# Patient Record
Sex: Female | Born: 1935
Health system: Southern US, Community
[De-identification: ages and names within clinical notes are randomized; demographics above are authoritative.]

## PROBLEM LIST (undated history)

## (undated) DIAGNOSIS — I340 Nonrheumatic mitral (valve) insufficiency: Secondary | ICD-10-CM

## (undated) DIAGNOSIS — D649 Anemia, unspecified: Secondary | ICD-10-CM

## (undated) DIAGNOSIS — I714 Abdominal aortic aneurysm, without rupture, unspecified: Secondary | ICD-10-CM

## (undated) DIAGNOSIS — I509 Heart failure, unspecified: Secondary | ICD-10-CM

## (undated) DIAGNOSIS — R569 Unspecified convulsions: Secondary | ICD-10-CM

## (undated) DIAGNOSIS — I4891 Unspecified atrial fibrillation: Secondary | ICD-10-CM

## (undated) DIAGNOSIS — D72819 Decreased white blood cell count, unspecified: Secondary | ICD-10-CM

## (undated) DIAGNOSIS — E785 Hyperlipidemia, unspecified: Secondary | ICD-10-CM

## (undated) DIAGNOSIS — I4892 Unspecified atrial flutter: Secondary | ICD-10-CM

## (undated) DIAGNOSIS — K635 Polyp of colon: Secondary | ICD-10-CM

## (undated) DIAGNOSIS — S0591XA Unspecified injury of right eye and orbit, initial encounter: Secondary | ICD-10-CM

## (undated) DIAGNOSIS — S82899A Other fracture of unspecified lower leg, initial encounter for closed fracture: Secondary | ICD-10-CM

## (undated) DIAGNOSIS — N390 Urinary tract infection, site not specified: Secondary | ICD-10-CM

## (undated) DIAGNOSIS — M1990 Unspecified osteoarthritis, unspecified site: Secondary | ICD-10-CM

## (undated) DIAGNOSIS — M810 Age-related osteoporosis without current pathological fracture: Secondary | ICD-10-CM

## (undated) HISTORY — PX: MITRAL VALVULOPLASTY: SHX143

## (undated) HISTORY — PX: OTHER SURGICAL HISTORY: SHX169

## (undated) HISTORY — PX: CLOSED REDUCTION HUMERAL SHAFT FRACTURE: SUR223

## (undated) HISTORY — PX: ABDOMINAL HYSTERECTOMY: SHX81

## (undated) HISTORY — PX: ORIF WRIST FRACTURE: SHX2133

## (undated) HISTORY — PX: FOOT SURGERY: SHX648

## (undated) HISTORY — PX: EYE SURGERY: SHX253

## (undated) HISTORY — PX: CLOSED REDUCTION RADIAL SHAFT FRACTURE: SUR239

---

## 2004-07-21 ENCOUNTER — Ambulatory Visit: Payer: Self-pay | Admitting: Internal Medicine

## 2005-04-02 ENCOUNTER — Ambulatory Visit: Payer: Self-pay | Admitting: Unknown Physician Specialty

## 2005-09-10 ENCOUNTER — Ambulatory Visit: Payer: Self-pay | Admitting: Internal Medicine

## 2006-06-17 ENCOUNTER — Ambulatory Visit: Payer: Self-pay | Admitting: Internal Medicine

## 2006-06-21 ENCOUNTER — Ambulatory Visit: Payer: Self-pay | Admitting: Internal Medicine

## 2006-09-15 ENCOUNTER — Ambulatory Visit: Payer: Self-pay | Admitting: Internal Medicine

## 2007-07-05 ENCOUNTER — Ambulatory Visit: Payer: Self-pay | Admitting: Urology

## 2007-09-19 ENCOUNTER — Ambulatory Visit: Payer: Self-pay | Admitting: Internal Medicine

## 2008-06-26 ENCOUNTER — Ambulatory Visit: Payer: Self-pay | Admitting: Unknown Physician Specialty

## 2008-09-20 ENCOUNTER — Ambulatory Visit: Payer: Self-pay | Admitting: Internal Medicine

## 2009-09-23 ENCOUNTER — Ambulatory Visit: Payer: Self-pay | Admitting: Internal Medicine

## 2010-11-04 ENCOUNTER — Ambulatory Visit: Payer: Self-pay | Admitting: Internal Medicine

## 2011-11-18 ENCOUNTER — Ambulatory Visit: Payer: Self-pay | Admitting: Internal Medicine

## 2012-02-06 ENCOUNTER — Inpatient Hospital Stay: Payer: Self-pay | Admitting: Specialist

## 2012-02-06 LAB — URINALYSIS, COMPLETE
Bilirubin,UR: NEGATIVE
Blood: NEGATIVE
Glucose,UR: NEGATIVE mg/dL (ref 0–75)
Ph: 8 (ref 4.5–8.0)
Protein: NEGATIVE
Squamous Epithelial: NONE SEEN
WBC UR: 8 /HPF (ref 0–5)

## 2012-02-06 LAB — CBC
Platelet: 225 10*3/uL (ref 150–440)
RBC: 4.06 10*6/uL (ref 3.80–5.20)
RDW: 13.2 % (ref 11.5–14.5)
WBC: 5.7 10*3/uL (ref 3.6–11.0)

## 2012-02-06 LAB — BASIC METABOLIC PANEL
Anion Gap: 6 — ABNORMAL LOW (ref 7–16)
BUN: 13 mg/dL (ref 7–18)
Chloride: 106 mmol/L (ref 98–107)
Co2: 30 mmol/L (ref 21–32)
Creatinine: 0.75 mg/dL (ref 0.60–1.30)
EGFR (Non-African Amer.): >60
Potassium: 3.9 mmol/L (ref 3.5–5.1)

## 2012-02-06 LAB — PROTIME-INR
INR: 0.9
Prothrombin Time: 12.2 secs (ref 11.5–14.7)

## 2012-02-06 LAB — PHENYTOIN LEVEL, TOTAL: Dilantin: 10.9 ug/mL (ref 10.0–20.0)

## 2012-02-06 LAB — PHENOBARBITAL LEVEL: Phenobarbital: 15.6 ug/mL (ref 15.0–40.0)

## 2012-02-07 LAB — BASIC METABOLIC PANEL
Anion Gap: 5 — ABNORMAL LOW (ref 7–16)
BUN: 11 mg/dL (ref 7–18)
Chloride: 106 mmol/L (ref 98–107)
Co2: 28 mmol/L (ref 21–32)
Creatinine: 0.78 mg/dL (ref 0.60–1.30)
Glucose: 147 mg/dL — ABNORMAL HIGH (ref 65–99)
Sodium: 139 mmol/L (ref 136–145)

## 2012-02-07 LAB — CBC WITH DIFFERENTIAL/PLATELET
Basophil %: 0.2 %
Eosinophil %: 0 %
HCT: 32.9 % — ABNORMAL LOW (ref 35.0–47.0)
HGB: 11.2 g/dL — ABNORMAL LOW (ref 12.0–16.0)
Lymphocyte #: 0.6 10*3/uL — ABNORMAL LOW (ref 1.0–3.6)
MCH: 32.8 pg (ref 26.0–34.0)
MCV: 97 fL (ref 80–100)
Monocyte %: 9.6 %
Neutrophil #: 7.2 10*3/uL — ABNORMAL HIGH (ref 1.4–6.5)
Platelet: 204 10*3/uL (ref 150–440)
RDW: 13.6 % (ref 11.5–14.5)
WBC: 8.7 10*3/uL (ref 3.6–11.0)

## 2012-02-08 LAB — URINALYSIS, COMPLETE
Bacteria: NONE SEEN
Bilirubin,UR: NEGATIVE
Blood: NEGATIVE
Glucose,UR: NEGATIVE mg/dL (ref 0–75)
Ketone: NEGATIVE
Ph: 7 (ref 4.5–8.0)
RBC,UR: 1 /HPF (ref 0–5)
Squamous Epithelial: 1
Transitional Epi: 1
WBC UR: 1 /HPF (ref 0–5)

## 2012-02-08 LAB — HEMOGLOBIN: HGB: 10.4 g/dL — ABNORMAL LOW (ref 12.0–16.0)

## 2012-02-09 LAB — CBC WITH DIFFERENTIAL/PLATELET
Basophil %: 0.9 %
Eosinophil #: 0.1 10*3/uL (ref 0.0–0.7)
Eosinophil %: 3.1 %
HGB: 9.9 g/dL — ABNORMAL LOW (ref 12.0–16.0)
Lymphocyte %: 18.5 %
Monocyte %: 12.4 %
Neutrophil %: 65.1 %

## 2012-02-09 LAB — BASIC METABOLIC PANEL
Chloride: 109 mmol/L — ABNORMAL HIGH (ref 98–107)
Co2: 27 mmol/L (ref 21–32)
EGFR (African American): >60
EGFR (Non-African Amer.): >60
Glucose: 104 mg/dL — ABNORMAL HIGH (ref 65–99)
Potassium: 3.6 mmol/L (ref 3.5–5.1)

## 2012-02-10 ENCOUNTER — Encounter: Payer: Self-pay | Admitting: Internal Medicine

## 2012-02-10 LAB — CBC WITH DIFFERENTIAL/PLATELET
Eosinophil #: 0.2 10*3/uL (ref 0.0–0.7)
Eosinophil %: 5.8 %
HCT: 28.7 % — ABNORMAL LOW (ref 35.0–47.0)
Lymphocyte #: 1.2 10*3/uL (ref 1.0–3.6)
Lymphocyte %: 33 %
MCV: 96 fL (ref 80–100)
Monocyte %: 13.6 %
Neutrophil #: 1.6 10*3/uL (ref 1.4–6.5)
Platelet: 201 10*3/uL (ref 150–440)
RBC: 2.98 10*6/uL — ABNORMAL LOW (ref 3.80–5.20)
WBC: 3.6 10*3/uL (ref 3.6–11.0)

## 2012-03-08 ENCOUNTER — Encounter: Payer: Self-pay | Admitting: Internal Medicine

## 2012-04-20 ENCOUNTER — Ambulatory Visit: Payer: Self-pay | Admitting: Ophthalmology

## 2012-04-20 LAB — HEMOGLOBIN: HGB: 13.6 g/dL (ref 12.0–16.0)

## 2012-05-02 ENCOUNTER — Ambulatory Visit: Payer: Self-pay | Admitting: Ophthalmology

## 2012-11-18 ENCOUNTER — Ambulatory Visit: Payer: Self-pay | Admitting: Internal Medicine

## 2012-11-25 ENCOUNTER — Ambulatory Visit: Payer: Self-pay | Admitting: Internal Medicine

## 2013-12-29 ENCOUNTER — Ambulatory Visit: Payer: Self-pay | Admitting: Internal Medicine

## 2014-06-28 DIAGNOSIS — D509 Iron deficiency anemia, unspecified: Secondary | ICD-10-CM | POA: Diagnosis not present

## 2014-06-28 DIAGNOSIS — E785 Hyperlipidemia, unspecified: Secondary | ICD-10-CM | POA: Diagnosis not present

## 2014-06-28 DIAGNOSIS — D72819 Decreased white blood cell count, unspecified: Secondary | ICD-10-CM | POA: Diagnosis not present

## 2014-06-28 DIAGNOSIS — R569 Unspecified convulsions: Secondary | ICD-10-CM | POA: Diagnosis not present

## 2014-06-28 DIAGNOSIS — M810 Age-related osteoporosis without current pathological fracture: Secondary | ICD-10-CM | POA: Diagnosis not present

## 2014-06-28 DIAGNOSIS — M1990 Unspecified osteoarthritis, unspecified site: Secondary | ICD-10-CM | POA: Diagnosis not present

## 2014-06-28 DIAGNOSIS — I059 Rheumatic mitral valve disease, unspecified: Secondary | ICD-10-CM | POA: Diagnosis not present

## 2014-07-05 DIAGNOSIS — M150 Primary generalized (osteo)arthritis: Secondary | ICD-10-CM | POA: Diagnosis not present

## 2014-07-05 DIAGNOSIS — E780 Pure hypercholesterolemia: Secondary | ICD-10-CM | POA: Diagnosis not present

## 2014-07-05 DIAGNOSIS — D72819 Decreased white blood cell count, unspecified: Secondary | ICD-10-CM | POA: Diagnosis not present

## 2014-07-05 DIAGNOSIS — M810 Age-related osteoporosis without current pathological fracture: Secondary | ICD-10-CM | POA: Diagnosis not present

## 2014-07-19 DIAGNOSIS — Z961 Presence of intraocular lens: Secondary | ICD-10-CM | POA: Diagnosis not present

## 2014-07-27 DIAGNOSIS — Z4420 Encounter for fitting and adjustment of artificial eye, unspecified: Secondary | ICD-10-CM | POA: Diagnosis not present

## 2014-09-25 NOTE — H&P (Signed)
Subjective/Chief Complaint Pain left hip and wrist    History of Present Illness 81 year old female fell in here garden today when stepping on an unstable paver.  Fell and injured left wrist and hip.  Brought to Emergency Room where exam and X-rays show a very comminuted, displaced left Colles fracture and a non displaced intertrochanteric fracture left hip.  Have discussed treatment with patient and daughters and have recommended open reduction and internal fixation of the left hip and wrist to facilitate healing and mobility afterwards.  Have discussed possibility of skilled nursing facility for a time also.  They agree with the proposed surgery and wish it done today if possible.  Risks and benefits of surgery were discussed at length including but not limited to infection, non union, nerve or blood vessed damage, non union, need for repeat surgery, blood clots and lung emboli, and death. Plan left hip pinning and open reduction and internal fixation of wrisrt. She complains of some numbness in median nerve distribution so we will also release the carpal tunnel at surgery.    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 Active  alendronate 70 mg oral tablet 1 tab(s) orally once a week on Sunday before eating or drinking. Active  Centrum Silver oral tablet 1 tab(s) orally once a day (in the morning) Active  Vitamin A, D oral capsule 1 cap(s) orally once a day (in the morning). Active  Caltrate 600 mg oral tablet 1 tab(s) orally 2 times a day Active  Protegra oral tablet 1 tab(s) orally once a day (in the morning)  Active  Vitamin B Complex 100 1 tab(s) orally once a day (in the evening). Active  Slow Fe (as elemental iron) 45 mg oral tablet, extended release 1 tab(s) orally once a day (in the evening). Active  Bayer Aspirin Regimen 81 mg oral delayed release tablet 1 tab(s) orally once a day Active   Family and Social History:   Family History Non-Contributory    Social History negative tobacco    Place of Living Home   Review of Systems:   Fever/Chills No    Cough No    Sputum No    Abdominal Pain No   Physical Exam:   GEN well developed, well nourished, no acute distress    HEENT pink conjunctivae    NECK supple    RESP normal resp effort    CARD regular rate    ABD denies tenderness    LYMPH negative neck    EXTR negative edema, Left leg painful with range of motion.  circulation/sensation/motor function good distally.  skin intact.   Left wrist deformed and painful.  skin intact.  some numbness in median nerve distribution.  motors good.    SKIN normal to palpation    NEURO motor/sensory function intact    PSYCH alert, A+O to time, place, person, good insight   Lab Results: TDMs:  31-Aug-13 10:12    Dilantin, Serum 10.9 (Result(s) reported on 06 Feb 2012 at 10:59AM.)   Phenobarbital, Serum 15.6 (Result(s) reported on 06 Feb 2012 at 10:59AM.)  Routine Chem:  31-Aug-13 10:12    Glucose, Serum 98   BUN 13   Creatinine (comp) 0.75   Sodium, Serum 142   Potassium, Serum 3.9   Chloride, Serum 106   CO2, Serum 30   Calcium (Total), Serum 9.5   Anion Gap  6   Osmolality (calc) 283   eGFR (African American) >60   eGFR (Non-African American) >60 (eGFR values <31m/min/1.73 m2 may be an indication of chronic kidney disease (CKD). Calculated eGFR is useful in patients with stable renal function. The eGFR calculation will not be reliable in acutely ill patients when serum creatinine is changing rapidly. It is not useful in  patients on dialysis. The eGFR calculation  may not be applicable to patients at the low and high extremes of body sizes, pregnant women, and vegetarians.)  Routine Coag:  31-Aug-13 10:12    Prothrombin 12.2   INR 0.9 (INR reference interval applies to patients on anticoagulant therapy. A single INR therapeutic range for coumarins is not optimal for all indications; however, the suggested range for most indications is 2.0 - 3.0. Exceptions to the INR Reference Range may include: Prosthetic heart valves, acute myocardial infarction, prevention of myocardial infarction, and combinations of aspirin and anticoagulant. The need for a higher or lower target INR must be assessed individually. Reference: The Pharmacology and Management of the Vitamin K  antagonists: the seventh ACCP Conference on Antithrombotic and Thrombolytic Therapy. CWOEHO.1224Sept:126 (3suppl): [id number] A HCT value >55% may artifactually increase the PT.  In one study,  the increase was an average of 25%. Reference:  "Effect on Routine and Special Coagulation Testing Values of Citrate Anticoagulant Adjustment in Patients with High HCT Values." American Journal of Clinical Pathology 2006;126:400-405.)  Routine Hem:  31-Aug-13 10:12    WBC (CBC) 5.7   RBC (CBC) 4.06   Hemoglobin (CBC) 13.4   Hematocrit (CBC) 38.9   Platelet Count (CBC) 225 (Result(s) reported on 06 Feb 2012 at 10:55AM.)   MCV 96   MCH 33.0   MCHC 34.5   RDW 13.2     Assessment/Admission Diagnosis 1)  Left hikp inteertrochanteric fracture  2)  Left colles fracture with median nerve symtoms.   Electronic Signatures: MPark Breed(MD)  (Signed 31-Aug-13 13:37)  Authored: CHIEF COMPLAINT and HISTORY, ALLERGIES, HOME MEDICATIONS, FAMILY AND SOCIAL HISTORY, REVIEW OF SYSTEMS, PHYSICAL EXAM, LABS, ASSESSMENT AND PLAN   Last Updated: 31-Aug-13 13:37 by MPark Breed(MD)

## 2014-09-25 NOTE — Op Note (Signed)
PATIENT NAME:  Kelly Swanson, Kelly Swanson MR#:  [id number] DATE OF BIRTH:  April 24, 1932  DATE OF PROCEDURE:  02/06/2012  PREOPERATIVE DIAGNOSES:  1. Nondisplaced intertrochanteric fracture of the left hip. 2. Highly comminuted displaced left Colles fracture with median nerve contusion.   POSTOPERATIVE DIAGNOSES: 1. Nondisplaced intertrochanteric fracture of the left hip. 2. Highly comminuted displaced left Colles fracture with median nerve contusion.   PROCEDURES:  1. Left hip nailing with a Synthes DHS compression nail (135 degree/4 hole plate, 80 mm lag screw). 2. Open reduction and internal fixation of left distal radius (Standard [employer] distal volar radial plate, 0.62 K wire, 5 mL of Wright calcium carbonate solution).  SURGEON: Park Breed, M.D.   ANESTHESIA: General endotracheal.   COMPLICATIONS: None.   DRAINS: Two Hemovacs, left hip.   ESTIMATED BLOOD LOSS: 200 mL.  REPLACEMENT: None.   DESCRIPTION OF PROCEDURE: The patient was brought to the Operating Room where she underwent satisfactory general endotracheal anesthesia, in the supine position, on the fracture table. The right leg was flexed and abducted and the left leg was placed in gentle traction and internally rotated slightly. Fluoroscopy showed the fracture remained well reduced. The hip was prepped and draped in sterile fashion and a longitudinal incision was made along the lateral aspect of the proximal femur. Dissection was carried out sharply through subcutaneous tissue and fascia. The vastus lateralis musculature was elevated anteriorly off the bone. A 1/4-inch drill hole was made in the cortex and a guidepin was inserted at 135 degree angle until it was in good position on AP and lateral views. A step cut reamer was then used. The track was tapped. An 80 mm lag screw with a 135 degree four hole plate was introduced and seated snugly and appropriately. The traction was released. The plate was affixed to the shaft of  the femur with four cortical screws. Fluoroscopy showed the hardware to be in good position and the fracture to be unchanged. I did not feel a compression screw was necessary. The wound was then irrigated and closed with 0 Vicryl on the fascia over a medium Hemovac drain. The subcutaneous tissue was closed with 2-0 Vicryl over another Hemovac. Skin was closed with staples. A dry sterile dressing was applied and the Hemovac was activated.   At this point, all the drapes were removed and a hand table was brought in. The left arm was prepped and draped in sterile fashion. An Esmarch was applied. The tourniquet was inflated to 250 mmHg. Tourniquet time was 95 minutes. A longitudinal incision was made over the volar radial aspect of the distal forearm. Dissection was carried out carefully through subcutaneous tissue. There was extensive hemorrhage. Flexor carpi radialis tendon sheath was opened and the tendon was retracted ulnarly. The median nerve was identified and followed proximally and distally. It was hemorrhagic. The patient did have complaints of numbness in the hand and her family noted after surgery that she has had carpal tunnel symptoms but no surgery in the past. Another incision was made in the palm for carpal tunnel release. Dissection was carried out bluntly through subcutaneous tissue. The distal aspect of the volar carpal ligament was identified and a Kelly clamp passed beneath it. The volar ligament was divided distal to proximal and then from proximal to distal. This released completely and the nerve was examined throughout and was hemorrhagic proximal to the carpal tunnel. Motor branch was intact. The musculature and nerve were retracted ulnarly and the volar aspect of the  radius exposed. The pronator muscle was released radially and retracted ulnarly. The fracture was highly comminuted. The radial styloid was a large piece. The wound was irrigated. I applied fingertrap traction to the fingers to  maintain length, reduced the fragments and then pinned them radial styloid with a 0.62 K wire. This provided better stability. A standard left [employer] distal volar radial plate was placed over the volar aspect of the radius and pinned in place. Fluoroscopy showed it to be in good position. The fractures were satisfactorily reduced. The plate was affixed to the shaft with four cortical screws. The radial styloid was affixed with two fully threaded screws and two more pegs were inserted more ulnarly. Because of the comminution and instability, I mixed 5 mL of [employer] calcium carbonate solution with saline and then injected this into the metaphyseal region and irrigated excess away. The K wire was bent and cut. Fluoroscopy showed the fracture to be adequately reduced with no shortening and no angulation. There was satisfactory stability to gentle stress. The wound was again irrigated and the subcutaneous tissue closed with 4-0 Vicryl and the skin closed with staples. The carpal tunnel incision was closed with running 5-0 nylon. I did not inject any Marcaine due to the numbness in her median nerve already. A dry sterile dressing with volar splint was applied and the tourniquet deflated with good return of blood flow to the hand. Tourniquet time was 95 minutes.   The patient was then awakened and transferred to her hospital bed and taken to recovery in good condition. ____________________________ Park Breed, MD hem:slb D: 02/06/2012 18:50:26 ET T: 02/07/2012 15:01:15 ET JOB#: [id number]  cc: Park Breed, MD, <Dictator> Park Breed MD ELECTRONICALLY SIGNED 02/08/2012 11:21

## 2014-09-25 NOTE — Consult Note (Signed)
Brief Consult Note: Diagnosis: preoperative evaluation for pt who is undergoing L hip fx repair, L wrist Fx,  MV repain, HL, fe defficiecny anemia, epilepsy.   Patient was seen by consultant.   Consult note dictated.   Recommend to proceed with surgery or procedure.   Discussed with Attending MD.   Comments: 1. preoperative evaluation for pt who is going to :L hip fx repair surgery, EKG is unremarkable, with no ischaemic changes, Pt is active, no h/o CP, SOB, last stress test Dec 2009, negative for ischaemia, echo 2009, normal EF at 50%, pt has minor clinical predictors for perioperative cardiac complications, discussed with family/daughtert  and patient, OK to go to OR 2. HL, low fat diet 3. seizure d/o , contineu phenobarb and dilantin, get levels 4. osteoporosis, conti neu ca, add fosamax when able to sit upright Thanks for consult, we'll follow along 3.  Electronic Signatures: Theodoro Grist (MD)  (Signed 31-Aug-13 14:02)  Authored: Brief Consult Note   Last Updated: 31-Aug-13 14:02 by Theodoro Grist (MD)

## 2014-09-25 NOTE — Op Note (Signed)
PATIENT NAME:  Kelly Swanson, ROEPER MR#:  [id number] DATE OF BIRTH:  07-Jul-1931  DATE OF PROCEDURE:  05/02/2012  PREOPERATIVE DIAGNOSIS: Cataract, left eye.   POSTOPERATIVE DIAGNOSIS: Cataract, left eye.   PROCEDURE PERFORMED: Extracapsular cataract extraction using phacoemulsification with placement of an Alcon [id number] 19.5-diopter posterior chamber lens with 3.75-diopters a cylinder, serial number [id number].   SURGEON: Loura Back. Kamiah Fite, M.D.   ANESTHESIA: 4% lidocaine and 0.75% Marcaine in a 50-50 mixture with 10 units/mL of  Hylenex added given as a peribulbar.   ANESTHESIOLOGIST: Vashti Hey, MD   COMPLICATIONS: None.   ESTIMATED BLOOD LOSS: Less than 1 mL.   DESCRIPTION OF PROCEDURE: The patient was brought to the Operating Room and given topical proparacaine. She was then set upright, and fixing on a distant target the 3:00 and 9:00 positions were marked on the eye. The patient was placed supine, given IV sedation and peribulbar block. She was then prepped and draped in the usual fashion. Vertical rectus muscles were imbricated using 5-0 silk sutures, bridle sutures. The toric degree marker was brought to the table and lined up with the 3:00 and 9:00 positions, and the 174-degree position was marked on the cornea; also, the 90-degree position was marked. Peritomy was carried out for one clock hour at 90 degrees. A partial thickness scleral groove was made at the posterior surgical limbus and dissected anteriorly into clear cornea with an [employer]. The anterior chamber was entered superotemporally through clear cornea with a paracentesis knife and through the lamellar dissection with a 2.6 mm keratome. DisCoVisc was used to replace the aqueous, and a continuous tear circular capsulorrhexis was carried out. Hydrodelineation was used to loosen the nucleus, and phacoemulsification was carried out in a divide and conquer technique. Ultrasound time was one minute and 25.7 seconds with  an average power of 13.9%, a CDE of 20.05. Irrigation/aspiration was used to remove the residual cortex. The capsular bag was inflated with DisCoVisc, and the intraocular lens was inserted into the capsular bag. The lens was rotated so the marks in the haptics were lined up with the 174-degree marks made on the cornea. Irrigation-aspiration was used to remove the residual DisCoVisc. The position of the lens was rechecked and found to be good. The wound was inflated with balanced salt and Miostat was injected through the paracentesis tract. Because the patient was one eyed  a single 10-0 nylon suture was placed across the wound. Conjunctiva was closed with cautery. The bridle sutures were removed, and two drops of Vigamox were placed on the eye. The patient had a shield placed over the eye and was discharged to the recovery room in good condition.   ____________________________ Loura Back Jaqualin Serpa, MD sad:cbb D: 05/02/2012 14:36:42 ET T: 05/02/2012 15:40:29 ET JOB#: [id number]  cc: Remo Lipps A. Sequoyah Ramone, MD, <Dictator> Martie Lee MD ELECTRONICALLY SIGNED 05/09/2012 13:14

## 2014-09-25 NOTE — Consult Note (Signed)
PATIENT NAME:  Kelly Swanson, Kelly Swanson MR#:  [id number] DATE OF BIRTH:  02-15-1932  DATE OF CONSULTATION:  02/06/2012  REFERRING PHYSICIAN:  Earnestine Leys, MD CONSULTING PHYSICIAN:  Theodoro Grist, MD  PRIMARY CARE PHYSICIAN: Ramonita Lab, MD  REASON FOR CONSULTATION: Preoperative clearance of the patient who is about to go to operating room for left hip fracture repair.   HISTORY OF PRESENT ILLNESS: The patient is a 81 year old Caucasian female who has history of mitral valve repair in the past, history of hyperlipidemia, iron deficiency anemia, cholelithiasis, and epilepsy who presented to the hospital after she fell down at home. Apparently, she went outside to work in the yard, she was raking, and suddenly stepped backwards and fell down. She knew that something happened. She just could not get up because of severe pain. She was able to crawl approximately 10 feet and call her daughter who lives close by and EMS was called and she was brought to the Emergency Room. In the Emergency Room, she was noted to have left distal wrist radius as well as ulnar fracture and nondisplaced  fracture along the base of the femoral neck on the left.  Dr. Sabra Heck is the physician who is admitting the patient to the hospital and consultation was requested for clearance.   PAST MEDICAL HISTORY:  1. History of seizure disorder. 2. Hormone replacement therapy. 3. Severe osteoporosis with previous bilateral arm fractures after fall. 4. Degenerative joint disease with bilateral foot pain, followed by Dr. Elvina Mattes. 5. History of colon polyps with colonoscopy in 2003. 6. History of hyperlipidemia with elevated HDL. 7. Mitral valve regurgitation status post mitral valvuloplasty with Carpentier ring placement, followed by Dr. Saralyn Pilar. Surgery performed in 1996. Echocardiogram done in 2009 with normal left ventricular function, moderate mitral regurgitation as well as tricuspid regurgitation, diastolic dysfunction, and mild  interatrial septal aneurysm. Myoview was negative for ischemia in December 2009. 8. Trauma of right eye with subsequent ablation and artificial eye implantation.  9. Persistent leukopenia with baseline white blood cell count of 3.5 to 4 with normal neutrophil count, thought to be due to Dilantin.  10. History of iron deficiency anemia. Gastroenterology evaluation in December 2006 revealing mild gastritis and colon polyps, improved with iron supplements. 11. History of recurrent laryngitis likely secondary to allergic rhinitis. 12. History of recurrent urinary tract infections with microscopic hematuria. Ultrasound revealing small stone in the right kidney. Evaluated by Dr. Bernardo Heater with no further intervention needed. 13. Gallstone incidentally noted on ultrasound in January 2008. The patient is asymptomatic.  PAST SURGICAL HISTORY:  1. Hysterectomy. 2. Mitral valvuloplasty with Carpentier Edwards ring placement.  3. Left foot surgery by Dr. Elvina Mattes in March 2005.  4. Left humeral fracture.  5. Right radial fracture with correction.   MEDICATIONS: According to patient's medication list. 1. Alendronate 70 mg p.o. weekly on Sundays. 2. Bayer aspirin 81 mg p.o. daily.  3. Caltrate 600 mg twice daily. 4. Centrum Silver once daily. 5. Dilantin 1 mg two capsules in the morning and one capsule in the evening. 6. Phenobarbital 64.8 mg once daily in the evening. 7. Protegra oral tablet once daily. 8. Slow iron 45 mg oral iron once daily. 9. Vitamin A as well as B oral capsule once daily. 10. Vitamin B complex 100 mg once daily.   ALLERGIES: No known drug allergies.   SOCIAL HISTORY: Remote tobacco abuse. Rare alcohol. Works at [employer] and has been there for 25 years.   FAMILY HISTORY: Father has psoriasis. The patient's  one brother and one sister had liver cancer and there is breast cancer in the family. The patient's son died at age of 82 of sudden death. She has a daughter who lives next  door.  HEALTH MAINTENANCE: Tetanus shot in December 2004. Flu shots yearly. Pneumovax April 2008. Mammogram scheduled in 2013. Zostavax in 2013. Colonoscopy in 2010, hyperplastic polyps, recommended again in 2015. DEXA scan scheduled in 2013 and was performed in May which showed osteoporosis, femoral neck T score -2.9, recommended Actonel her current dose. Pelvic exam in 2012 and breast examination in 2013.  REVIEW OF SYSTEMS: Positive for cataract surgery on the left which the patient is scheduled for on 03/01/2012. She has visual prosthesis on the right. CONSTITUTIONAL: She denies any fevers, chills, fatigue, or weakness. No pain. No weight loss or gain. EYES: Denies any blurry vision, double vision, or glaucoma. Admits of left cataract, as mentioned above, which is to be operated on. Denies any allergies, sinus pain, dentures, or difficulty swallowing. RESPIRATORY: Denies any cough, wheezing asthma, or chronic obstructive pulmonary disease. CARDIOVASCULAR: No chest pains, orthopnea, edema, arrhythmia, palpitations, or syncope. GASTROINTESTINAL: Denies nausea, diarrhea, or constipation. GENITOURINARY: Denies dysuria, hematuria, frequency, or incontinence. ENDOCRINE: Denies any polydipsia, nocturia, thyroid problems, heat or cold intolerance, or thirst. HEMATOLOGIC: Denies anemia, easy bruising, bleeding, or swollen glands. SKIN: Denies any acne, rash, lesions, or change in moles. MUSCULOSKELETAL: Denies arthritis, cramps, swelling, or gout. NEUROLOGIC: Denies numbness, epilepsy, or tremor. PSYCH: Denies any anxiety, insomnia, or depression.   PHYSICAL EXAMINATION:   VITALS: On arrival to the emergency room the patient's temperature was 99.1, pulse 81, respiration rate 18, blood pressure 114/65, and saturation 96% on room air.   GENERAL: Well developed, well nourished Caucasian female in no significant distress comfortable on the stretcher.   HEENT: Her pupils are equal and reactive to light.  Extraocular movements are intact. No icterus or conjunctivitis. Has normal hearing. No pharynx erythema. Mucosa is moist.   NECK: No mass, supple and nontender. Thyroid is not enlarged. No adenopathy. No JVD or carotid bruits bilaterally. Full range of motion.   LUNGS: Clear to auscultation in all fields. No rales, rhonchi, or wheezing. No labored respirations, increased effort, dullness to percussion, or overt respiratory distress.   CARDIOVASCULAR: S1 and S2 appreciated. No murmurs, gallops, or rubs noted. PMI is not lateralized. The patient does have a sound of a mitral valve which is exaggerated in precordium. Otherwise no gallops, murmurs or rubs were noted. No lower extremity edema, calf tenderness, or cyanosis was noted.  Pulses are 2+ bilaterally.   ABDOMEN: Soft, nontender. Bowel sounds present. No hepatosplenomegaly or masses were noted.   RECTAL: Deferred.  MUSCULOSKELETAL: Able to move all extremities except of left lower extremity as well as left upper extremity. The left upper extremity is actually in a cast.   SKIN: No rashes, lesions, erythema, nodularity, or induration. She does have a mild scrape noted on the left anterior tibia, but no bruising, nodularity, or induration. Skin was warm and dry to palpation.   LYMPH: No adenopathy in the cervical region.   NEUROLOGICAL: Cranial nerves grossly intact. Sensory intact. No dysarthria or aphasia.   PSYCH: The patient is alert and oriented to time, person, and place; cooperative. Memory is good. No significant confusion, agitation, or depression.  LABORATORY/DIAGNOSTIC  DATA: BMP was within normal limits. CBC is within normal limits with white blood cell count 5.7, hemoglobin 13.4, and platelet count 225. Coagulation panel is within normal limits. Dilantin level  10.9. Phenobarbital level 15.6.  EKG showed normal sinus rhythm, occasional premature ventricular complexes, incomplete right bundle branch block, nonspecific T wave  abnormality, and no acute ST-T changes however were noted.   RADIOLOGIC STUDIES: Pelvic AP x-ray on 02/06/2012 showed mock line versus possible fracture involving the left hip. Further evaluation with dedicated plain films are recommended.  Left femur x-ray on 02/06/2012 showed osteoarthritic changes without evidence of acute osseous abnormalities.   Left wrist complete x-ray on 02/06/2012 showed distal radius as well as ulnar fracture, as described above.   Chest x-ray, one view, revealed no evidence of acute cardiopulmonary disease. Prominence of interstitial markings likely representing component of underlying pulmonary fibrosis, according to the radiologist.   CT scan of the left hip without contrast showed a nondisplaced fracture along the base of the femoral neck on the left.   ASSESSMENT AND PLAN:  1. Preoperative evaluation of a patient who is about to go for left hip fracture repair surgery. EKG is unremarkable for acute changes, no ischemic changes. The patient is active. She has no history of chest pains or shortness of breath. The patient's last stress test in December 2009 was negative for ischemia. Echocardiogram was also normal in 2009 and ejection fraction was 50%. The patient has mild clinical predictors for preoperative cardiac complication. This was discussed with the family, the daughter, as well as the patient and physician, Dr. Earnestine Leys. The patient is okay to go to the operating room to proceed with the operation.  2. Hyperlipidemia. Low fat diet.  3. Seizure disorder. Continue phenobarbital as well as Dilantin. Levels are satisfactory.  4. Osteoporosis. Continue calcium supplements and Fosamax and add Fosamax when the patient is able to sit upright.   TIME SPENT: 50 minutes. ____________________________ Theodoro Grist, MD rv:slb D: 02/06/2012 14:16:32 ET T: 02/06/2012 15:14:59 ET JOB#: [id number]  cc: Theodoro Grist, MD, <Dictator> Adin Hector, MD [place] MD ELECTRONICALLY SIGNED 02/17/2012 22:11

## 2014-09-25 NOTE — Discharge Summary (Signed)
PATIENT NAME:  Kelly Swanson, Kelly Swanson MR#:  [id number] DATE OF BIRTH:  1932-01-25  DATE OF ADMISSION:  02/06/2012 DATE OF DISCHARGE:  02/10/2012  FINAL DIAGNOSES:  1. Comminuted displaced intertrochanteric fracture of the left distal radius/ulna.  2. Minimally displaced intertrochanteric fracture left hip.  3. History of seizure disorder.  4. Severe osteoporosis. 5. Previous bilateral arm fractures. 6. Bilateral foot pain.  7. History of colon polyps.  8. Hyperlipidemia.  9. Mitral valve regurgitation with mitral valvuloplasty in 1996. 10. Persistent leukopenia.  11. History of iron deficiency anemia.  12. Recurrent laryngitis. 13. Recurrent urinary tract infections. 14. Asymptomatic gallstone seen on ultrasound 2008.   OPERATIONS 02/06/2012:  1. Open reduction, internal fixation left distal radius fracture.  2. Open reduction, internal fixation left hip fracture with a Synthes DHS compression plate and screw.   COMPLICATIONS: None.   CONSULTATIONS:  1. [facility].  2. Dr. Ramonita Lab   DISCHARGE MEDICATIONS:  1. Fosamax 70 mg weekly.  2. Slow Iron 142 mcg daily.   3. Ocuvite, PreserVision  daily. 4. Phenobarbital 64.8 mg at bedtime.  5. Protonix 40 mg b.i.d.  6. Enteric-coated aspirin one p.o. b.i.d. for six weeks.  7. Norco 5/325 q. 4-6 h. p.r.n. pain.  8. Iron 1 p.o. daily for one month. 9. Dilantin 200 mg p.o. q. a.m. and Dilantin 100 mg at bedtime.   HISTORY OF PRESENT ILLNESS: The patient is a 81 year old very active female who was gardening on Saturday, 02/06/2012, when she stepped on an unstable stone and fell. She injured her hip and left wrist. She was found by her daughter a short time later and was brought to the Emergency Room by EMS. Exam and x-rays in the Emergency Room revealed a severely comminuted displaced intra-articular fracture of the left distal radius and ulna. She also had an  essentially nondisplaced fracture of the intertrochanteric area of the left hip. The  patient was seen by the medical service and cleared for surgery. She was taken to surgery that day.   PAST MEDICAL HISTORY:/ILLNESSES: As above.   MEDICATIONS: As above.   ALLERGIES: No known drug allergies.   PAST SURGICAL HISTORY:  1. Hysterectomy.  2. Mitral valvuloplasty.  3. Left foot surgery by Dr. Elvina Mattes. 4. Left humeral fracture, right radial fracture with correction.   SOCIAL HISTORY: The patient does not smoke. 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   FAMILY HISTORY: Unremarkable.  REVIEW OF SYSTEMS: Unremarkable.   PHYSICAL EXAMINATION: The patient was alert and cooperative. Her daughters were present. Vital signs were normal. The left wrist was shortened and impacted with swelling.  She had numbness in the median nerve distribution in the Emergency Room. She had prior right carpal tunnel release and had had symptoms on the left as well. The left hip showed pain with motion. There was no shortening or rotation.   LABORATORY DATA: Laboratory data on admission was satisfactory.   HOSPITAL COURSE: The patient was cleared for surgery that day. The risks and benefits of surgery were discussed with the patient and her daughters who agreed to proceed. She underwent left hip compression hip nailing and open reduction and internal fixation of left wrist on 02/06/2012.  Postoperatively she did well. Hemoglobin remained stable. It was 9.8 on the date discharge.  Dilantin level dropped from 10.9 to 7.2 on the day of discharge. Her Dilantin doses were readjusted to match her prehospitalization regimen and will  be rechecked in two days and the report sent to Dr. Ramonita Lab. Dressings were changed and a short arm cast applied to the left arm on the third postoperative day. She ambulated with a platform walker. She will be partial weight-bearing on the left. She will return to my office in two weeks for exam and x-rays.      ____________________________ Park Breed, MD hem:bjt D: 02/10/2012 14:08:40 ET T: 02/10/2012 14:23:24 ET JOB#: [id number]  cc: Park Breed, MD, <Dictator> Tama High III, MD Park Breed MD ELECTRONICALLY SIGNED 02/11/2012 9:11

## 2014-10-04 ENCOUNTER — Inpatient Hospital Stay
Admit: 2014-10-04 | Discharge: 2014-10-06 | Disposition: A | Discharge status: Home / Self Care | Payer: Commercial Managed Care - HMO | Source: Ambulatory Visit | Attending: Specialist | Admitting: Specialist

## 2014-10-04 DIAGNOSIS — I429 Cardiomyopathy, unspecified: Secondary | ICD-10-CM | POA: Diagnosis not present

## 2014-10-04 DIAGNOSIS — R160 Hepatomegaly, not elsewhere classified: Secondary | ICD-10-CM | POA: Diagnosis not present

## 2014-10-04 DIAGNOSIS — I499 Cardiac arrhythmia, unspecified: Secondary | ICD-10-CM | POA: Diagnosis not present

## 2014-10-04 DIAGNOSIS — I4891 Unspecified atrial fibrillation: Secondary | ICD-10-CM | POA: Diagnosis not present

## 2014-10-04 DIAGNOSIS — I4892 Unspecified atrial flutter: Secondary | ICD-10-CM | POA: Diagnosis not present

## 2014-10-04 DIAGNOSIS — R0602 Shortness of breath: Secondary | ICD-10-CM | POA: Diagnosis not present

## 2014-10-04 DIAGNOSIS — J439 Emphysema, unspecified: Secondary | ICD-10-CM | POA: Diagnosis not present

## 2014-10-04 DIAGNOSIS — Z7982 Long term (current) use of aspirin: Secondary | ICD-10-CM | POA: Diagnosis not present

## 2014-10-04 DIAGNOSIS — I482 Chronic atrial fibrillation: Secondary | ICD-10-CM | POA: Diagnosis not present

## 2014-10-04 DIAGNOSIS — G40909 Epilepsy, unspecified, not intractable, without status epilepticus: Secondary | ICD-10-CM | POA: Diagnosis not present

## 2014-10-04 DIAGNOSIS — I471 Supraventricular tachycardia: Secondary | ICD-10-CM | POA: Diagnosis not present

## 2014-10-04 DIAGNOSIS — I060 Rheumatic aortic stenosis: Secondary | ICD-10-CM | POA: Diagnosis not present

## 2014-10-04 DIAGNOSIS — R000 Tachycardia, unspecified: Secondary | ICD-10-CM | POA: Diagnosis not present

## 2014-10-04 DIAGNOSIS — Z87891 Personal history of nicotine dependence: Secondary | ICD-10-CM | POA: Diagnosis not present

## 2014-10-04 DIAGNOSIS — Z79899 Other long term (current) drug therapy: Secondary | ICD-10-CM | POA: Diagnosis not present

## 2014-10-04 DIAGNOSIS — M810 Age-related osteoporosis without current pathological fracture: Secondary | ICD-10-CM | POA: Diagnosis not present

## 2014-10-04 DIAGNOSIS — R002 Palpitations: Secondary | ICD-10-CM | POA: Diagnosis not present

## 2014-10-04 DIAGNOSIS — R569 Unspecified convulsions: Secondary | ICD-10-CM | POA: Diagnosis not present

## 2014-10-04 LAB — COMPREHENSIVE METABOLIC PANEL
Albumin: 4 g/dL
Alkaline Phosphatase: 55 U/L
Anion Gap: 8 (ref 7–16)
BUN: 17 mg/dL
Bilirubin,Total: 0.4 mg/dL
CALCIUM: 9.9 mg/dL
Chloride: 107 mmol/L
Co2: 27 mmol/L
Creatinine: 0.78 mg/dL
EGFR (Non-African Amer.): >60
GLUCOSE: 99 mg/dL
Potassium: 4.4 mmol/L
SGOT(AST): 33 U/L
SGPT (ALT): 29 U/L
Sodium: 142 mmol/L
Total Protein: 6.7 g/dL

## 2014-10-04 LAB — TROPONIN I
Troponin-I: <0.03 ng/mL
Troponin-I: <0.03 ng/mL

## 2014-10-04 LAB — PHENYTOIN LEVEL, TOTAL: DILANTIN: 4.6 ug/mL — AB

## 2014-10-04 LAB — PROTIME-INR
INR: 1
Prothrombin Time: 13.1 secs

## 2014-10-04 LAB — URINALYSIS, COMPLETE
BILIRUBIN, UR: NEGATIVE
Blood: NEGATIVE
GLUCOSE, UR: NEGATIVE mg/dL (ref 0–75)
Nitrite: NEGATIVE
Ph: 5 (ref 4.5–8.0)
Protein: NEGATIVE
Specific Gravity: 1.014 (ref 1.003–1.030)

## 2014-10-04 LAB — CBC
HCT: 43.1 % (ref 35.0–47.0)
HGB: 14.3 g/dL (ref 12.0–16.0)
MCH: 32.1 pg (ref 26.0–34.0)
MCHC: 33.3 g/dL (ref 32.0–36.0)
MCV: 97 fL (ref 80–100)
PLATELETS: 345 10*3/uL (ref 150–440)
RBC: 4.46 10*6/uL (ref 3.80–5.20)
RDW: 13.9 % (ref 11.5–14.5)
WBC: 5.4 10*3/uL (ref 3.6–11.0)

## 2014-10-04 LAB — PRO B NATRIURETIC PEPTIDE: B-Type Natriuretic Peptide: 484 pg/mL — ABNORMAL HIGH

## 2014-10-04 LAB — D-DIMER(ARMC): D-Dimer: 580 ng/ml

## 2014-10-04 LAB — TSH: Thyroid Stimulating Horm: 1.711 u[IU]/mL

## 2014-10-05 LAB — CBC WITH DIFFERENTIAL/PLATELET
BASOS ABS: 0.1 10*3/uL (ref 0.0–0.1)
Basophil %: 1.3 %
EOS ABS: 0.1 10*3/uL (ref 0.0–0.7)
Eosinophil %: 1.5 %
HCT: 40 % (ref 35.0–47.0)
HGB: 13.3 g/dL (ref 12.0–16.0)
Lymphocyte #: 1.4 10*3/uL (ref 1.0–3.6)
Lymphocyte %: 34.8 %
MCH: 31.9 pg (ref 26.0–34.0)
MCHC: 33.1 g/dL (ref 32.0–36.0)
MCV: 96 fL (ref 80–100)
Monocyte #: 0.4 x10 3/mm (ref 0.2–0.9)
Monocyte %: 10.3 %
NEUTROS ABS: 2.2 10*3/uL (ref 1.4–6.5)
Neutrophil %: 52.1 %
Platelet: 344 10*3/uL (ref 150–440)
RBC: 4.15 10*6/uL (ref 3.80–5.20)
RDW: 13.9 % (ref 11.5–14.5)
WBC: 4.1 10*3/uL (ref 3.6–11.0)

## 2014-10-05 LAB — TROPONIN I: Troponin-I: <0.03 ng/mL

## 2014-10-05 LAB — BASIC METABOLIC PANEL
ANION GAP: 6 — AB (ref 7–16)
BUN: 17 mg/dL
CHLORIDE: 110 mmol/L
CREATININE: 0.79 mg/dL
Calcium, Total: 9.3 mg/dL
Co2: 26 mmol/L
EGFR (Non-African Amer.): >60
Glucose: 94 mg/dL
Potassium: 3.7 mmol/L
Sodium: 142 mmol/L

## 2014-10-07 NOTE — H&P (Signed)
PATIENT NAME:  Kelly Swanson, Kelly Swanson MR#:  [id number] DATE OF BIRTH:  1932-05-30  DATE OF ADMISSION:  10/04/2014  PRIMARY CARE PHYSICIAN: Adin Hector, MD  CARDIOLOGIST: Isaias Cowman, MD   CHIEF COMPLAINT: Weakness and palpitations.   HISTORY OF PRESENT ILLNESS: This is an 81 year old female who presents to the hospital from her primary care physician's office due to weakness and palpitations. The patient says that when she woke up to ambulate yesterday morning, she was feeling quite weak and also felt like her heart was racing. She also had significant diaphoresis, as she woke up drenched in sweat a day before in the morning. She went to see her primary care physician, who noted her to be tachycardic. He did an EKG, which showed supraventricular tachycardia. He then sent her over to the ER for further evaluation. In the Emergency Room, the patient was still noted to be in supraventricular tachycardia with heart rates in the 120s to 130s. She was given a pulse dose of IV Cardizem without much improvement. The patient has a previous history of mitral valve repair and likely this is atrial flutter with RVR. Hospitalist services were contacted for further treatment and evaluation.   REVIEW OF SYSTEMS:  CONSTITUTIONAL: No documented fever. Positive weakness. No weight gain. No weight loss.  EYES: No blurred or double vision.  ENT: No tinnitus. No postnasal drip. No redness of the oropharynx.  RESPIRATORY: No cough. No wheeze. No hemoptysis. No dyspnea.  CARDIOVASCULAR: Positive palpitations, no chest pain. No orthopnea. No syncope.  GASTROINTESTINAL: No nausea. No vomiting. No diarrhea. No abdominal pain. No melena. No hematochezia.  GENITOURINARY: No dysuria. No hematuria.  ENDOCRINE: No polyuria, nocturia, heat or cold intolerance.  HEMATOLOGIC: No anemia. No bruising. No bleeding.  INTEGUMENTARY: No rashes. No lesions.  MUSCULOSKELETAL: No arthritis. No swelling. No gout.  NEUROLOGIC: No  numbness. No tingling. No ataxia. No seizure-type activity.  PSYCHIATRIC: No anxiety. No insomnia. No ADD.   PAST MEDICAL HISTORY: Consistent with history of mitral valve repair, history of seizures, history of osteoporosis.   ALLERGIES: No known drug allergies.   SOCIAL HISTORY: Used to be a smoker, quit 30+ years ago. No alcohol abuse. No illicit drug abuse. Lives by herself.   FAMILY HISTORY: Mother and father are both deceased. Father died from complications of heart problems. Mother died from old age.   CURRENT MEDICATIONS: As follows: Fosamax 35 mg weekly, aspirin 81 mg daily, vitamin B complex 1 tablet daily, Caltrate with vitamin D 1 tab b.i.d., Centrum multivitamin daily, Ocuvite daily, phenobarbital 64.8 mg tablet 1 daily, Dilantin 300 mg at bedtime, slow iron supplement 45 mg at bedtime, vitamin A and D supplements daily.   PHYSICAL EXAMINATION: Presently is as follows:  VITAL SIGNS: Noted to be: Temperature is 97.9, pulse 119, respirations 19, blood pressure 108/84, saturations are 95% on room air.  GENERAL: She is a pleasant-appearing female in no apparent distress. HEAD, EYES, EARS, NOSE, AND THROAT: She is atraumatic, normocephalic. Her extraocular muscles are intact. Her pupils are equal and reactive to light. Her sclerae are anicteric. No conjunctival injection. No oropharyngeal erythema.  NECK: Supple. There is no jugular venous distention, no bruits, no lymphadenopathy, no thyromegaly. HEART: Tachycardic. Regular. No murmurs, no rubs, no clicks.  LUNGS: Clear to auscultation bilaterally. No rales or rhonchi. No wheezes.  ABDOMEN: Soft, flat, nontender, nondistended. Has good bowel sounds. No hepatosplenomegaly appreciated.  EXTREMITIES: No evidence of any cyanosis, clubbing, or peripheral edema. Has +2 pedal and radial  pulses bilaterally.  NEUROLOGIC: The patient is alert, awake, and oriented x 3 with no focal motor or sensory deficit appreciated bilaterally.  SKIN: Moist,  warm with no rashes appreciated.  LYMPHATIC: There is no cervical or axillary lymphadenopathy.   LABORATORY DATA: Showed a serum glucose of 99, BUN 17, creatinine 0.7, sodium 142, potassium 4.4, chloride 107, bicarbonate 27. LFTs are within normal limits. Troponin less than 0.03. TSH 1.7. White cell count 5.4, hemoglobin 14.3, hematocrit 43.1, platelet count 345,000. INR is 1.0. Urinalysis within normal limits.   IMAGING: The patient did have a CT scan of the chest done with contrast showing no evidence of pulmonary embolism, left atrial enlargement, status post mitral valve surgery, mild emphysema, indeterminate large mass involving the dome of the right hepatic lobe. This could represent a benign or malignant lesion; further evaluation with a nonemergent MRI is recommended.   ASSESSMENT AND PLAN: This is an 81 year old female with a history of mitral valve repair, history of seizures, osteoporosis, who presents to the hospital with weakness and palpitations from primary care physician's office and noted to be in supraventricular tachycardia.  1.  Supraventricular tachycardia. This is likely atrial flutter, given the patient's history of mitral valve repair. The patient's rates are currently uncontrolled. She was given 1 dose of intravenous Cardizem without much improvement. For now, we will place her on oral metoprolol. We will get a cardiology consult. I have discussed the case with Dr. Saralyn Pilar, who will see the patient tomorrow. He recommended starting the patient on oral Eliquis and plans for possible ablation. The patient likely needs an EP study and an ablation down the road. We will get a 2-dimensional echocardiogram, follow her clinically.  2.  History of seizures. Continue with phenobarbital and Dilantin. The patient has no acute seizure-type activity.  3.  History of osteoporosis. Continue with calcium and vitamin D supplements.  4.  Hepatic lesion. This was incidentally noted on a CT scan. I  will get an MRI of her abdomen to further evaluate this.   CODE STATUS: The patient is a full code.  TIME SPENT WITH THE ADMISSION: 50 minutes.    ____________________________ Belia Heman. Verdell Carmine, MD vjs:ST D: 10/04/2014 11:02:11 ET T: 10/04/2014 22:23:47 ET JOB#: [id number]  cc: Belia Heman. Verdell Carmine, MD, <Dictator> Henreitta Leber MD ELECTRONICALLY SIGNED 10/05/2014 14:44

## 2014-10-07 NOTE — Consult Note (Addendum)
PATIENT NAME:  Kelly Swanson, Kelly Swanson MR#:  [id number] DATE OF BIRTH:  1932-04-09  DATE OF CONSULTATION:  10/05/2014  REFERRING PHYSICIAN:   CONSULTING PHYSICIAN:  Isaias Cowman, MD  CARDIOLOGY CONSULTATION  PRIMARY CARE PHYSICIAN: Adin Hector, MD   CARDIOLOGIST: Isaias Cowman, MD   REASON FOR CONSULTATION: Atrial flutter.   HISTORY OF PRESENT ILLNESS: The patient is an 81 year old female with known mitral valve disease, status post mitral valve repair. She apparently was in her usual state of health until 2-3 days prior to admission, when she noted generalized weakness and lack of exercise tolerance. She went to see her primary care physician on 10/04/2014, at which time she was noted to be tachycardic. EKG revealed narrow complex tachycardia at a rate of 130. The patient was sent to the [facility] Emergency Room, where she was given a bolus of IV Cardizem, without significant improvement. The patient was started on metoprolol 25 mg p.o. q. 6. The patient has ruled out for myocardial infarction by CPK, isoenzymes, and troponin. Blood pressure is low normal. The patient has continued to be in narrow complex tachycardia, with some improvement in heart rate, now 115-120 beats per minute. Review of the EKG and telemetry is most consistent with atrial flutter.   PAST MEDICAL HISTORY:  1. History of mitral valve repair.  2. History of seizures.   HOME MEDICATIONS: Aspirin 81 mg daily, Fosamax 35 mg q. weekly, vitamin B complex 1 daily, Caltrate with vitamin D 1 b.i.d., Centrum multivitamin, Ocuvite 1 daily, phenobarbital 64.8 mg daily, Dilantin 300 mg at bedtime, slow iron 45 mg at bedtime.   SOCIAL HISTORY: The patient quit tobacco use 30 years ago. She currently lives alone.   FAMILY HISTORY: Father with history of heart problems.   REVIEW OF SYSTEMS. : CONSTITUTIONAL: No fever or chills.  EYES: No blurry vision.  EARS: No hearing loss.  RESPIRATORY: Some mild exertional dyspnea.   CARDIOVASCULAR: No chest pain.  GASTROINTESTINAL: No nausea, vomiting, or diarrhea.  GENITOURINARY: No dysuria or hematuria.  ENDOCRINE: No polyuria or polydipsia.  MUSCULOSKELETAL: No arthralgias or myalgias.  NEUROLOGICAL: No focal muscle weakness or numbness.  PSYCHOLOGICAL: No depression or anxiety.   PHYSICAL EXAMINATION: VITAL SIGNS: Heart rate was 120, blood pressure 101/76, pulse 97.8, pulse oximetry 95%.  HEENT: Pupils equal and reactive to light and accommodation.  NECK: Supple without thyromegaly.  LUNGS: Clear.  CARDIOVASCULAR: Normal JVP. Normal PMI. Tachycardia. Normal S1, S2. No appreciable gallop, murmur, or rub.  ABDOMEN: Soft and nontender. Pulses were intact, bilaterally.  MUSCULOSKELETAL: Normal muscle tone.  NEUROLOGIC: The patient is alert and oriented x 3. Motor and sensory are both grossly intact.   IMPRESSION: An 81 year old female with history of mitral valve repair, who presents with atrial flutter, clinically hemodynamically stable. Atrial flutter resistant to initial medical therapy, which has included metoprolol and a dose of intravenous Cardizem. Blood pressure is low normal.   RECOMMENDATIONS: 1. Agree with overall current therapy.  2. Agree with stroke prevention with Eliquis 5 mg b.i.d.  3. Review 2-D echocardiogram.  4. In light of the patient's low blood pressure, we will load with digoxin 0.25 mg q. 2 hours x 3 doses.  5. Further recommendations including antiarrhythmic therapy, cardioversion, or potential catheter ablation pending the patient's clinical course and echocardiogram results.     ____________________________ Isaias Cowman, MD ap:mw D: 10/05/2014 08:51:41 ET T: 10/05/2014 10:15:00 ET JOB#: [id number]  cc: Isaias Cowman, MD, <Dictator> Isaias Cowman MD ELECTRONICALLY SIGNED 10/06/2014 11:11

## 2014-10-08 NOTE — Discharge Summary (Signed)
PATIENT NAME:  Kelly Swanson, Kelly Swanson MR#:  [id number] DATE OF BIRTH:  15-Jun-1931  DATE OF ADMISSION:  10/04/2014 DATE OF DISCHARGE:  10/06/2014  TYPE OF DISCHARGE:  Patient  transferred home.    REASON FOR ADMISSION: Weakness and palpitations.   HISTORY OF PRESENT ILLNESS: Please see the dictated HPI done by Dr. Verdell Carmine on 10/04/2014.   PAST MEDICAL HISTORY: 1. Status post mitral valve repair.  2. Osteoporosis.  3. Seizure disorder.   MEDICATIONS ON ADMISSION: Please see admission note.   ALLERGIES: No known drug allergies.   SOCIAL HISTORY:  As per admission note.    FAMILY HISTORY:  As per admission note.   REVIEW OF SYSTEMS:  As per admission note.   PHYSICAL EXAMINATION: GENERAL: The patient was in no acute distress.  VITAL SIGNS: Remarkable for a heart rate of 119, which was regular but rapid.  HEENT: Unremarkable.  NECK: Supple without JVD.  LUNGS: Clear.  CARDIAC: Had a rapid rate with a regular rhythm. Normal S1 and S2.  ABDOMEN: Soft and nontender.  EXTREMITIES: Without edema.  NEUROLOGIC: Grossly nonfocal.   HOSPITAL COURSE: The patient was admitted with rapid atrial flutter. She was given IV Cardizem with no improvement. She ruled out for an MI by enzymes. She was seen in consultation by cardiology. Echocardiogram revealed mild cardiomyopathy with ejection fraction of 40-45%. She was started on flecainide with improvement of her symptoms. She converted to a normal sinus rhythm. She remained stable. She was started on Eliquis. She was cleared for discharge by cardiology and is now sent home for further care and outpatient work-up.   DISCHARGE DIAGNOSES: 1. Rapid atrial flutter, resolved.  2. Status post mitral valve repair.  3. Seizure disorder.  4. Osteoporosis.   DISCHARGE MEDICATIONS: 1. Eliquis 5 mg p.o. b.i.d.  2. Phenobarbital 164.8 mg p.o. at bedtime and 200 mg p.o. q.a.m.  3. Ambien 5 mg p.o. at bedtime.  4. Flecainide 50 mg p.o. b.i.d.  5. Trazodone 25 mg  p.o. at bedtime p.r.n. sleep.  6. Pepcid 20 mg p.o. b.i.d.  7. Metoprolol 25 mg p.o. b.i.d.   FOLLOW-UP PLANS AND APPOINTMENTS: The patient was discharged on a low fat, low cholesterol diet. She will follow up with Dr. Caryl Comes and Dr. Saralyn Pilar next week in the office, sooner if needed.    ____________________________ Leonie Douglas Doy Hutching, MD jds:tr D: 10/06/2014 08:10:51 ET T: 10/06/2014 16:15:37 ET JOB#: [id number]  cc: Leonie Douglas. Doy Hutching, MD, <Dictator> Kirston Luty Lennice Sites MD ELECTRONICALLY SIGNED 10/06/2014 18:01

## 2014-10-09 DIAGNOSIS — I340 Nonrheumatic mitral (valve) insufficiency: Secondary | ICD-10-CM | POA: Diagnosis not present

## 2014-10-09 DIAGNOSIS — E780 Pure hypercholesterolemia: Secondary | ICD-10-CM | POA: Diagnosis not present

## 2014-10-09 DIAGNOSIS — I4892 Unspecified atrial flutter: Secondary | ICD-10-CM | POA: Diagnosis not present

## 2014-10-12 DIAGNOSIS — E780 Pure hypercholesterolemia: Secondary | ICD-10-CM | POA: Diagnosis not present

## 2014-10-12 DIAGNOSIS — R569 Unspecified convulsions: Secondary | ICD-10-CM | POA: Diagnosis not present

## 2014-10-12 DIAGNOSIS — I4892 Unspecified atrial flutter: Secondary | ICD-10-CM | POA: Diagnosis not present

## 2014-10-12 DIAGNOSIS — D509 Iron deficiency anemia, unspecified: Secondary | ICD-10-CM | POA: Diagnosis not present

## 2014-12-28 DIAGNOSIS — R739 Hyperglycemia, unspecified: Secondary | ICD-10-CM | POA: Diagnosis not present

## 2014-12-28 DIAGNOSIS — E780 Pure hypercholesterolemia: Secondary | ICD-10-CM | POA: Diagnosis not present

## 2014-12-28 DIAGNOSIS — M810 Age-related osteoporosis without current pathological fracture: Secondary | ICD-10-CM | POA: Diagnosis not present

## 2014-12-28 DIAGNOSIS — R569 Unspecified convulsions: Secondary | ICD-10-CM | POA: Diagnosis not present

## 2014-12-28 DIAGNOSIS — D72819 Decreased white blood cell count, unspecified: Secondary | ICD-10-CM | POA: Diagnosis not present

## 2014-12-28 DIAGNOSIS — D509 Iron deficiency anemia, unspecified: Secondary | ICD-10-CM | POA: Diagnosis not present

## 2015-01-08 ENCOUNTER — Other Ambulatory Visit: Payer: Self-pay | Admitting: Internal Medicine

## 2015-01-08 DIAGNOSIS — M150 Primary generalized (osteo)arthritis: Secondary | ICD-10-CM | POA: Diagnosis not present

## 2015-01-08 DIAGNOSIS — D72819 Decreased white blood cell count, unspecified: Secondary | ICD-10-CM | POA: Diagnosis not present

## 2015-01-08 DIAGNOSIS — E780 Pure hypercholesterolemia: Secondary | ICD-10-CM | POA: Diagnosis not present

## 2015-01-08 DIAGNOSIS — Z23 Encounter for immunization: Secondary | ICD-10-CM | POA: Diagnosis not present

## 2015-01-08 DIAGNOSIS — Z1231 Encounter for screening mammogram for malignant neoplasm of breast: Secondary | ICD-10-CM

## 2015-01-08 DIAGNOSIS — R569 Unspecified convulsions: Secondary | ICD-10-CM | POA: Diagnosis not present

## 2015-01-09 ENCOUNTER — Other Ambulatory Visit: Payer: Self-pay | Admitting: Internal Medicine

## 2015-01-09 ENCOUNTER — Ambulatory Visit
Admission: RE | Admit: 2015-01-09 | Discharge: 2015-01-09 | Disposition: A | Discharge status: Home / Self Care | Payer: Commercial Managed Care - HMO | Source: Ambulatory Visit | Attending: Internal Medicine | Admitting: Internal Medicine

## 2015-01-09 DIAGNOSIS — I340 Nonrheumatic mitral (valve) insufficiency: Secondary | ICD-10-CM | POA: Diagnosis not present

## 2015-01-09 DIAGNOSIS — Z1231 Encounter for screening mammogram for malignant neoplasm of breast: Secondary | ICD-10-CM | POA: Insufficient documentation

## 2015-01-09 DIAGNOSIS — I059 Rheumatic mitral valve disease, unspecified: Secondary | ICD-10-CM | POA: Diagnosis not present

## 2015-01-09 DIAGNOSIS — E780 Pure hypercholesterolemia: Secondary | ICD-10-CM | POA: Diagnosis not present

## 2015-01-09 DIAGNOSIS — I4892 Unspecified atrial flutter: Secondary | ICD-10-CM | POA: Diagnosis not present

## 2015-02-05 DIAGNOSIS — Z4421 Encounter for fitting and adjustment of artificial right eye: Secondary | ICD-10-CM | POA: Diagnosis not present

## 2015-02-05 DIAGNOSIS — Z4420 Encounter for fitting and adjustment of artificial eye, unspecified: Secondary | ICD-10-CM | POA: Diagnosis not present

## 2015-07-04 DIAGNOSIS — R739 Hyperglycemia, unspecified: Secondary | ICD-10-CM | POA: Diagnosis not present

## 2015-07-04 DIAGNOSIS — E559 Vitamin D deficiency, unspecified: Secondary | ICD-10-CM | POA: Diagnosis not present

## 2015-07-04 DIAGNOSIS — D509 Iron deficiency anemia, unspecified: Secondary | ICD-10-CM | POA: Diagnosis not present

## 2015-07-04 DIAGNOSIS — E7800 Pure hypercholesterolemia, unspecified: Secondary | ICD-10-CM | POA: Diagnosis not present

## 2015-07-04 DIAGNOSIS — D72819 Decreased white blood cell count, unspecified: Secondary | ICD-10-CM | POA: Diagnosis not present

## 2015-07-04 DIAGNOSIS — R569 Unspecified convulsions: Secondary | ICD-10-CM | POA: Diagnosis not present

## 2015-07-11 DIAGNOSIS — I059 Rheumatic mitral valve disease, unspecified: Secondary | ICD-10-CM | POA: Diagnosis not present

## 2015-07-11 DIAGNOSIS — R569 Unspecified convulsions: Secondary | ICD-10-CM | POA: Diagnosis not present

## 2015-07-11 DIAGNOSIS — M150 Primary generalized (osteo)arthritis: Secondary | ICD-10-CM | POA: Diagnosis not present

## 2015-07-11 DIAGNOSIS — D72818 Other decreased white blood cell count: Secondary | ICD-10-CM | POA: Diagnosis not present

## 2015-07-11 DIAGNOSIS — I340 Nonrheumatic mitral (valve) insufficiency: Secondary | ICD-10-CM | POA: Diagnosis not present

## 2015-07-11 DIAGNOSIS — I4892 Unspecified atrial flutter: Secondary | ICD-10-CM | POA: Diagnosis not present

## 2015-07-11 DIAGNOSIS — M810 Age-related osteoporosis without current pathological fracture: Secondary | ICD-10-CM | POA: Diagnosis not present

## 2015-07-11 DIAGNOSIS — I714 Abdominal aortic aneurysm, without rupture: Secondary | ICD-10-CM | POA: Diagnosis not present

## 2015-07-11 DIAGNOSIS — D509 Iron deficiency anemia, unspecified: Secondary | ICD-10-CM | POA: Diagnosis not present

## 2015-07-11 DIAGNOSIS — E7800 Pure hypercholesterolemia, unspecified: Secondary | ICD-10-CM | POA: Diagnosis not present

## 2015-07-16 DIAGNOSIS — Z4420 Encounter for fitting and adjustment of artificial eye, unspecified: Secondary | ICD-10-CM | POA: Diagnosis not present

## 2015-08-13 DIAGNOSIS — H02836 Dermatochalasis of left eye, unspecified eyelid: Secondary | ICD-10-CM | POA: Diagnosis not present

## 2015-08-13 DIAGNOSIS — Z961 Presence of intraocular lens: Secondary | ICD-10-CM | POA: Diagnosis not present

## 2015-11-21 DIAGNOSIS — R21 Rash and other nonspecific skin eruption: Secondary | ICD-10-CM | POA: Diagnosis not present

## 2015-11-21 DIAGNOSIS — L989 Disorder of the skin and subcutaneous tissue, unspecified: Secondary | ICD-10-CM | POA: Diagnosis not present

## 2015-11-25 DIAGNOSIS — L720 Epidermal cyst: Secondary | ICD-10-CM | POA: Diagnosis not present

## 2015-11-25 DIAGNOSIS — L538 Other specified erythematous conditions: Secondary | ICD-10-CM | POA: Diagnosis not present

## 2015-11-25 DIAGNOSIS — L728 Other follicular cysts of the skin and subcutaneous tissue: Secondary | ICD-10-CM | POA: Diagnosis not present

## 2015-11-25 DIAGNOSIS — L821 Other seborrheic keratosis: Secondary | ICD-10-CM | POA: Diagnosis not present

## 2015-11-25 DIAGNOSIS — R208 Other disturbances of skin sensation: Secondary | ICD-10-CM | POA: Diagnosis not present

## 2015-12-02 ENCOUNTER — Other Ambulatory Visit: Payer: Self-pay | Admitting: Internal Medicine

## 2015-12-02 DIAGNOSIS — Z1231 Encounter for screening mammogram for malignant neoplasm of breast: Secondary | ICD-10-CM

## 2015-12-26 DIAGNOSIS — M25571 Pain in right ankle and joints of right foot: Secondary | ICD-10-CM | POA: Diagnosis not present

## 2015-12-26 DIAGNOSIS — S9001XA Contusion of right ankle, initial encounter: Secondary | ICD-10-CM | POA: Diagnosis not present

## 2015-12-26 DIAGNOSIS — S82841A Displaced bimalleolar fracture of right lower leg, initial encounter for closed fracture: Secondary | ICD-10-CM | POA: Diagnosis not present

## 2016-01-06 DIAGNOSIS — M25571 Pain in right ankle and joints of right foot: Secondary | ICD-10-CM | POA: Diagnosis not present

## 2016-01-06 DIAGNOSIS — S82841A Displaced bimalleolar fracture of right lower leg, initial encounter for closed fracture: Secondary | ICD-10-CM | POA: Diagnosis not present

## 2016-01-06 DIAGNOSIS — M79674 Pain in right toe(s): Secondary | ICD-10-CM | POA: Diagnosis not present

## 2016-01-06 DIAGNOSIS — B351 Tinea unguium: Secondary | ICD-10-CM | POA: Diagnosis not present

## 2016-01-10 ENCOUNTER — Ambulatory Visit: Payer: Commercial Managed Care - HMO

## 2016-02-03 DIAGNOSIS — S82841D Displaced bimalleolar fracture of right lower leg, subsequent encounter for closed fracture with routine healing: Secondary | ICD-10-CM | POA: Diagnosis not present

## 2016-02-03 DIAGNOSIS — M25571 Pain in right ankle and joints of right foot: Secondary | ICD-10-CM | POA: Diagnosis not present

## 2016-02-07 ENCOUNTER — Ambulatory Visit: Payer: Commercial Managed Care - HMO

## 2016-02-13 DIAGNOSIS — R569 Unspecified convulsions: Secondary | ICD-10-CM | POA: Diagnosis not present

## 2016-02-13 DIAGNOSIS — I4892 Unspecified atrial flutter: Secondary | ICD-10-CM | POA: Diagnosis not present

## 2016-02-13 DIAGNOSIS — I714 Abdominal aortic aneurysm, without rupture: Secondary | ICD-10-CM | POA: Diagnosis not present

## 2016-02-18 DIAGNOSIS — Z23 Encounter for immunization: Secondary | ICD-10-CM | POA: Diagnosis not present

## 2016-02-20 DIAGNOSIS — D509 Iron deficiency anemia, unspecified: Secondary | ICD-10-CM | POA: Diagnosis not present

## 2016-02-20 DIAGNOSIS — M810 Age-related osteoporosis without current pathological fracture: Secondary | ICD-10-CM | POA: Diagnosis not present

## 2016-02-20 DIAGNOSIS — I4892 Unspecified atrial flutter: Secondary | ICD-10-CM | POA: Diagnosis not present

## 2016-02-20 DIAGNOSIS — R0602 Shortness of breath: Secondary | ICD-10-CM | POA: Diagnosis not present

## 2016-02-20 DIAGNOSIS — I714 Abdominal aortic aneurysm, without rupture: Secondary | ICD-10-CM | POA: Diagnosis not present

## 2016-02-20 DIAGNOSIS — I340 Nonrheumatic mitral (valve) insufficiency: Secondary | ICD-10-CM | POA: Diagnosis not present

## 2016-02-20 DIAGNOSIS — D72818 Other decreased white blood cell count: Secondary | ICD-10-CM | POA: Diagnosis not present

## 2016-02-20 DIAGNOSIS — R569 Unspecified convulsions: Secondary | ICD-10-CM | POA: Diagnosis not present

## 2016-02-20 DIAGNOSIS — E7800 Pure hypercholesterolemia, unspecified: Secondary | ICD-10-CM | POA: Diagnosis not present

## 2016-02-21 ENCOUNTER — Other Ambulatory Visit: Payer: Self-pay | Admitting: Internal Medicine

## 2016-02-21 DIAGNOSIS — I714 Abdominal aortic aneurysm, without rupture, unspecified: Secondary | ICD-10-CM

## 2016-02-24 ENCOUNTER — Ambulatory Visit
Admission: RE | Admit: 2016-02-24 | Discharge: 2016-02-24 | Disposition: A | Discharge status: Home / Self Care | Payer: Commercial Managed Care - HMO | Source: Ambulatory Visit | Attending: Internal Medicine | Admitting: Internal Medicine

## 2016-02-24 DIAGNOSIS — I700 Atherosclerosis of aorta: Secondary | ICD-10-CM | POA: Insufficient documentation

## 2016-02-24 DIAGNOSIS — K8020 Calculus of gallbladder without cholecystitis without obstruction: Secondary | ICD-10-CM | POA: Insufficient documentation

## 2016-02-24 DIAGNOSIS — I714 Abdominal aortic aneurysm, without rupture, unspecified: Secondary | ICD-10-CM

## 2016-02-24 DIAGNOSIS — N281 Cyst of kidney, acquired: Secondary | ICD-10-CM | POA: Diagnosis not present

## 2016-02-25 ENCOUNTER — Other Ambulatory Visit: Payer: Self-pay | Admitting: Internal Medicine

## 2016-02-26 ENCOUNTER — Other Ambulatory Visit: Payer: Self-pay | Admitting: Internal Medicine

## 2016-02-26 DIAGNOSIS — R600 Localized edema: Secondary | ICD-10-CM

## 2016-03-02 DIAGNOSIS — S82841D Displaced bimalleolar fracture of right lower leg, subsequent encounter for closed fracture with routine healing: Secondary | ICD-10-CM | POA: Diagnosis not present

## 2016-03-05 ENCOUNTER — Ambulatory Visit: Payer: Commercial Managed Care - HMO

## 2016-03-05 DIAGNOSIS — I340 Nonrheumatic mitral (valve) insufficiency: Secondary | ICD-10-CM | POA: Diagnosis not present

## 2016-03-10 DIAGNOSIS — R0602 Shortness of breath: Secondary | ICD-10-CM | POA: Diagnosis not present

## 2016-03-10 DIAGNOSIS — I4892 Unspecified atrial flutter: Secondary | ICD-10-CM | POA: Diagnosis not present

## 2016-03-10 DIAGNOSIS — I714 Abdominal aortic aneurysm, without rupture: Secondary | ICD-10-CM | POA: Diagnosis not present

## 2016-03-10 DIAGNOSIS — E7800 Pure hypercholesterolemia, unspecified: Secondary | ICD-10-CM | POA: Diagnosis not present

## 2016-03-10 DIAGNOSIS — I340 Nonrheumatic mitral (valve) insufficiency: Secondary | ICD-10-CM | POA: Diagnosis not present

## 2016-03-18 DIAGNOSIS — I480 Paroxysmal atrial fibrillation: Secondary | ICD-10-CM | POA: Diagnosis not present

## 2016-03-20 DIAGNOSIS — I714 Abdominal aortic aneurysm, without rupture: Secondary | ICD-10-CM | POA: Diagnosis not present

## 2016-03-20 DIAGNOSIS — I480 Paroxysmal atrial fibrillation: Secondary | ICD-10-CM | POA: Diagnosis not present

## 2016-03-20 DIAGNOSIS — E7800 Pure hypercholesterolemia, unspecified: Secondary | ICD-10-CM | POA: Diagnosis not present

## 2016-03-20 DIAGNOSIS — I340 Nonrheumatic mitral (valve) insufficiency: Secondary | ICD-10-CM | POA: Diagnosis not present

## 2016-03-20 DIAGNOSIS — Z01818 Encounter for other preprocedural examination: Secondary | ICD-10-CM | POA: Diagnosis not present

## 2016-03-20 DIAGNOSIS — I4892 Unspecified atrial flutter: Secondary | ICD-10-CM | POA: Diagnosis not present

## 2016-03-27 MED ORDER — BUPIVACAINE HCL (PF) 0.5 % IJ SOLN
INTRAMUSCULAR | Status: AC
  Filled 2016-03-27: qty 30

## 2016-03-31 ENCOUNTER — Ambulatory Visit: Payer: Commercial Managed Care - HMO | Admitting: Anesthesiology

## 2016-03-31 ENCOUNTER — Encounter
Admission: RE | Disposition: A | Discharge status: Home / Self Care | Payer: Self-pay | Source: Ambulatory Visit | Attending: Cardiology

## 2016-03-31 ENCOUNTER — Ambulatory Visit
Admission: RE | Admit: 2016-03-31 | Discharge: 2016-03-31 | Disposition: A | Discharge status: Home / Self Care | Payer: Commercial Managed Care - HMO | Source: Ambulatory Visit | Attending: Cardiology | Admitting: Cardiology

## 2016-03-31 ENCOUNTER — Encounter: Payer: Self-pay | Admitting: *Deleted

## 2016-03-31 DIAGNOSIS — Z79899 Other long term (current) drug therapy: Secondary | ICD-10-CM | POA: Diagnosis not present

## 2016-03-31 DIAGNOSIS — Z970 Presence of artificial eye: Secondary | ICD-10-CM | POA: Insufficient documentation

## 2016-03-31 DIAGNOSIS — Z90710 Acquired absence of both cervix and uterus: Secondary | ICD-10-CM | POA: Diagnosis not present

## 2016-03-31 DIAGNOSIS — Z7901 Long term (current) use of anticoagulants: Secondary | ICD-10-CM | POA: Insufficient documentation

## 2016-03-31 DIAGNOSIS — Z9889 Other specified postprocedural states: Secondary | ICD-10-CM | POA: Insufficient documentation

## 2016-03-31 DIAGNOSIS — I4891 Unspecified atrial fibrillation: Secondary | ICD-10-CM | POA: Diagnosis not present

## 2016-03-31 DIAGNOSIS — I4892 Unspecified atrial flutter: Secondary | ICD-10-CM | POA: Diagnosis not present

## 2016-03-31 DIAGNOSIS — R569 Unspecified convulsions: Secondary | ICD-10-CM | POA: Diagnosis not present

## 2016-03-31 DIAGNOSIS — Z87440 Personal history of urinary (tract) infections: Secondary | ICD-10-CM | POA: Insufficient documentation

## 2016-03-31 DIAGNOSIS — Z9842 Cataract extraction status, left eye: Secondary | ICD-10-CM | POA: Diagnosis not present

## 2016-03-31 DIAGNOSIS — Z8673 Personal history of transient ischemic attack (TIA), and cerebral infarction without residual deficits: Secondary | ICD-10-CM | POA: Diagnosis not present

## 2016-03-31 DIAGNOSIS — M810 Age-related osteoporosis without current pathological fracture: Secondary | ICD-10-CM | POA: Insufficient documentation

## 2016-03-31 DIAGNOSIS — Z7983 Long term (current) use of bisphosphonates: Secondary | ICD-10-CM | POA: Insufficient documentation

## 2016-03-31 DIAGNOSIS — E785 Hyperlipidemia, unspecified: Secondary | ICD-10-CM | POA: Insufficient documentation

## 2016-03-31 DIAGNOSIS — I714 Abdominal aortic aneurysm, without rupture: Secondary | ICD-10-CM | POA: Insufficient documentation

## 2016-03-31 DIAGNOSIS — I480 Paroxysmal atrial fibrillation: Secondary | ICD-10-CM | POA: Insufficient documentation

## 2016-03-31 DIAGNOSIS — D72819 Decreased white blood cell count, unspecified: Secondary | ICD-10-CM | POA: Diagnosis not present

## 2016-03-31 DIAGNOSIS — Z86010 Personal history of colonic polyps: Secondary | ICD-10-CM | POA: Diagnosis not present

## 2016-03-31 DIAGNOSIS — D649 Anemia, unspecified: Secondary | ICD-10-CM | POA: Diagnosis not present

## 2016-03-31 DIAGNOSIS — I739 Peripheral vascular disease, unspecified: Secondary | ICD-10-CM | POA: Diagnosis not present

## 2016-03-31 HISTORY — DX: Other fracture of unspecified lower leg, initial encounter for closed fracture: S82.899A

## 2016-03-31 HISTORY — DX: Polyp of colon: K63.5

## 2016-03-31 HISTORY — DX: Nonrheumatic mitral (valve) insufficiency: I34.0

## 2016-03-31 HISTORY — PX: ELECTROPHYSIOLOGIC STUDY: SHX172A

## 2016-03-31 HISTORY — DX: Abdominal aortic aneurysm, without rupture: I71.4

## 2016-03-31 HISTORY — DX: Unspecified injury of right eye and orbit, initial encounter: S05.91XA

## 2016-03-31 HISTORY — DX: Hyperlipidemia, unspecified: E78.5

## 2016-03-31 HISTORY — DX: Unspecified convulsions: R56.9

## 2016-03-31 HISTORY — DX: Abdominal aortic aneurysm, without rupture, unspecified: I71.40

## 2016-03-31 HISTORY — DX: Age-related osteoporosis without current pathological fracture: M81.0

## 2016-03-31 HISTORY — DX: Unspecified osteoarthritis, unspecified site: M19.90

## 2016-03-31 HISTORY — DX: Anemia, unspecified: D64.9

## 2016-03-31 HISTORY — DX: Decreased white blood cell count, unspecified: D72.819

## 2016-03-31 HISTORY — DX: Unspecified atrial flutter: I48.92

## 2016-03-31 HISTORY — DX: Urinary tract infection, site not specified: N39.0

## 2016-03-31 SURGERY — CARDIOVERSION (CATH LAB)
Anesthesia: General

## 2016-03-31 MED ORDER — METOPROLOL SUCCINATE ER 50 MG PO TB24
ORAL_TABLET | ORAL | Status: AC
  Filled 2016-03-31: qty 1

## 2016-03-31 MED ORDER — PROPOFOL 10 MG/ML IV BOLUS
INTRAVENOUS | Status: DC | PRN
  Administered 2016-03-31: 80 mg via INTRAVENOUS

## 2016-03-31 MED ORDER — SODIUM CHLORIDE 0.9 % IV SOLN
INTRAVENOUS | Status: DC | PRN
  Administered 2016-03-31: 08:00:00 via INTRAVENOUS

## 2016-03-31 MED ORDER — ONDANSETRON HCL 4 MG/2ML IJ SOLN: 4.0000 mg | Freq: Once | INTRAMUSCULAR | Status: DC | PRN

## 2016-03-31 MED ORDER — FENTANYL CITRATE (PF) 100 MCG/2ML IJ SOLN: 25.0000 ug | INTRAMUSCULAR | Status: DC | PRN

## 2016-03-31 NOTE — Op Note (Signed)
[facility] Cardiology   03/31/2016                     7:42 AM  PATIENT:  Kelly Swanson    PRE-OPERATIVE DIAGNOSIS:  Cardioversion    Afib  POST-OPERATIVE DIAGNOSIS:  Same  PROCEDURE:  CARDIOVERSION  SURGEON:  Isaias Cowman, MD    ANESTHESIA:     PREOPERATIVE INDICATIONS:  Kelly Swanson is a  82 y.o. female with a diagnosis of Cardioversion    Afib who failed conservative measures and elected for surgical management.    The risks benefits and alternatives were discussed with the patient preoperatively including but not limited to the risks of infection, bleeding, cardiopulmonary complications, the need for revision surgery, among others, and the patient was willing to proceed.   OPERATIVE PROCEDURE: The patient was brought to the special holding area in a fasting state. She received 80 mg of intravenous propofol. She underwent successive cardioversions at 7550 J, with successful conversion to sinus rhythm. There were no periprocedural complications. Patient was discharged home in stable condition.

## 2016-03-31 NOTE — Discharge Instructions (Signed)
Electrical Cardioversion, Care After °Refer to this sheet in the next few weeks. These instructions provide you with information on caring for yourself after your procedure. Your health care provider may also give you more specific instructions. Your treatment has been planned according to current medical practices, but problems sometimes occur. Call your health care provider if you have any problems or questions after your procedure. °WHAT TO EXPECT AFTER THE PROCEDURE °After your procedure, it is typical to have the following sensations: °· Some redness on the skin where the shocks were delivered. If this is tender, a sunburn lotion or hydrocortisone cream may help. °· Possible return of an abnormal heart rhythm within hours or days after the procedure. °HOME CARE INSTRUCTIONS °· Take medicines only as directed by your health care provider. Be sure you understand how and when to take your medicine. °· Learn how to feel your pulse and check it often. °· Limit your activity for 48 hours after the procedure or as directed by your health care provider. °· Avoid or minimize caffeine and other stimulants as directed by your health care provider. °SEEK MEDICAL CARE IF: °· You feel like your heart is beating too fast or your pulse is not regular. °· You have any questions about your medicines. °· You have bleeding that will not stop. °SEEK IMMEDIATE MEDICAL CARE IF: °· You are dizzy or feel faint. °· It is hard to breathe or you feel short of breath. °· There is a change in discomfort in your chest. °· Your speech is slurred or you have trouble moving an arm or leg on one side of your body. °· You get a serious muscle cramp that does not go away. °· Your fingers or toes turn cold or blue. °  °This information is not intended to replace advice given to you by your health care provider. Make sure you discuss any questions you have with your health care provider. °  °Document Released: 03/15/2013 Document Revised: 06/15/2014  Document Reviewed: 03/15/2013 °Elsevier Interactive Patient Education ©2016 [employer] ° °

## 2016-03-31 NOTE — Transfer of Care (Signed)
Immediate Anesthesia Transfer of Care Note  Patient: Kelly Swanson  Procedure(s) Performed: Procedure(s): CARDIOVERSION (N/A)  Patient Location: PACU and Short Stay  Anesthesia Type:General  Level of Consciousness: awake, alert  and oriented  Airway & Oxygen Therapy: Patient Spontanous Breathing and Patient connected to nasal cannula oxygen  Post-op Assessment: Report given to RN and Post -op Vital signs reviewed and stable  Post vital signs: Reviewed and stable  Last Vitals:  Vitals:   03/31/16 0714 03/31/16 0746  BP: (!) 143/101 107/75  Pulse: 85 63  Resp: 14 20  Temp: 36.5 C     Last Pain:  Vitals:   03/31/16 0714  TempSrc: Oral         Complications: No apparent anesthesia complications

## 2016-03-31 NOTE — Anesthesia Preprocedure Evaluation (Signed)
Anesthesia Evaluation  Patient identified by MRN, date of birth, ID band Patient awake    Reviewed: Allergy & Precautions, NPO status , Patient's Chart, lab work & pertinent test results, reviewed documented beta blocker date and time   Airway Mallampati: II  TM Distance: >3 FB     Dental   Pulmonary former smoker,    Pulmonary exam normal        Cardiovascular + Peripheral Vascular Disease  Normal cardiovascular exam+ dysrhythmias Atrial Fibrillation      Neuro/Psych Seizures -, Well Controlled,  negative psych ROS   GI/Hepatic negative GI ROS, Neg liver ROS,   Endo/Other  negative endocrine ROS  Renal/GU negative Renal ROS  negative genitourinary   Musculoskeletal  (+) Arthritis , Osteoarthritis,    Abdominal Normal abdominal exam  (+)   Peds negative pediatric ROS (+)  Hematology  (+) anemia ,   Anesthesia Other Findings   Reproductive/Obstetrics                             Anesthesia Physical Anesthesia Plan  ASA: III  Anesthesia Plan: General   Post-op Pain Management:    Induction: Intravenous  Airway Management Planned: Nasal Cannula  Additional Equipment:   Intra-op Plan:   Post-operative Plan:   Informed Consent: I have reviewed the patients History and Physical, chart, labs and discussed the procedure including the risks, benefits and alternatives for the proposed anesthesia with the patient or authorized representative who has indicated his/her understanding and acceptance.   Dental advisory given  Plan Discussed with: CRNA and Surgeon  Anesthesia Plan Comments:         Anesthesia Quick Evaluation

## 2016-04-01 ENCOUNTER — Encounter: Payer: Self-pay | Admitting: Cardiology

## 2016-04-01 NOTE — Anesthesia Postprocedure Evaluation (Signed)
Anesthesia Post Note  Patient: Kelly Swanson  Procedure(s) Performed: Procedure(s) (LRB): CARDIOVERSION (N/A)  Patient location during evaluation: PACU Anesthesia Type: General Level of consciousness: awake and alert and oriented Pain management: pain level controlled Vital Signs Assessment: post-procedure vital signs reviewed and stable Respiratory status: spontaneous breathing Cardiovascular status: blood pressure returned to baseline Anesthetic complications: no    Last Vitals:  Vitals:   03/31/16 0800 03/31/16 0835  BP: 120/80 122/80  Pulse: 66 68  Resp: 16 16  Temp:      Last Pain:  Vitals:   03/31/16 0714  TempSrc: Oral                 Juwuan Sedita

## 2016-04-07 DIAGNOSIS — I714 Abdominal aortic aneurysm, without rupture: Secondary | ICD-10-CM | POA: Diagnosis not present

## 2016-04-07 DIAGNOSIS — I480 Paroxysmal atrial fibrillation: Secondary | ICD-10-CM | POA: Diagnosis not present

## 2016-04-07 DIAGNOSIS — E7800 Pure hypercholesterolemia, unspecified: Secondary | ICD-10-CM | POA: Diagnosis not present

## 2016-04-07 DIAGNOSIS — R0602 Shortness of breath: Secondary | ICD-10-CM | POA: Diagnosis not present

## 2016-04-07 DIAGNOSIS — I4892 Unspecified atrial flutter: Secondary | ICD-10-CM | POA: Diagnosis not present

## 2016-04-07 DIAGNOSIS — I340 Nonrheumatic mitral (valve) insufficiency: Secondary | ICD-10-CM | POA: Diagnosis not present

## 2016-04-09 DIAGNOSIS — I340 Nonrheumatic mitral (valve) insufficiency: Secondary | ICD-10-CM | POA: Diagnosis not present

## 2016-04-09 DIAGNOSIS — I4892 Unspecified atrial flutter: Secondary | ICD-10-CM | POA: Diagnosis not present

## 2016-04-09 DIAGNOSIS — Z8679 Personal history of other diseases of the circulatory system: Secondary | ICD-10-CM | POA: Diagnosis not present

## 2016-04-09 DIAGNOSIS — I714 Abdominal aortic aneurysm, without rupture: Secondary | ICD-10-CM | POA: Diagnosis not present

## 2016-05-05 DIAGNOSIS — I4892 Unspecified atrial flutter: Secondary | ICD-10-CM | POA: Diagnosis not present

## 2016-05-05 DIAGNOSIS — E7800 Pure hypercholesterolemia, unspecified: Secondary | ICD-10-CM | POA: Diagnosis not present

## 2016-05-05 DIAGNOSIS — I480 Paroxysmal atrial fibrillation: Secondary | ICD-10-CM | POA: Diagnosis not present

## 2016-05-05 DIAGNOSIS — Z01818 Encounter for other preprocedural examination: Secondary | ICD-10-CM | POA: Diagnosis not present

## 2016-05-05 DIAGNOSIS — R600 Localized edema: Secondary | ICD-10-CM | POA: Diagnosis not present

## 2016-05-07 DIAGNOSIS — E7800 Pure hypercholesterolemia, unspecified: Secondary | ICD-10-CM | POA: Diagnosis not present

## 2016-05-07 DIAGNOSIS — R0602 Shortness of breath: Secondary | ICD-10-CM | POA: Diagnosis not present

## 2016-05-07 DIAGNOSIS — I4892 Unspecified atrial flutter: Secondary | ICD-10-CM | POA: Diagnosis not present

## 2016-05-07 DIAGNOSIS — I051 Rheumatic mitral insufficiency: Secondary | ICD-10-CM | POA: Diagnosis not present

## 2016-05-07 DIAGNOSIS — I714 Abdominal aortic aneurysm, without rupture: Secondary | ICD-10-CM | POA: Diagnosis not present

## 2016-05-12 DIAGNOSIS — Z0000 Encounter for general adult medical examination without abnormal findings: Secondary | ICD-10-CM | POA: Diagnosis not present

## 2016-05-12 DIAGNOSIS — M810 Age-related osteoporosis without current pathological fracture: Secondary | ICD-10-CM | POA: Diagnosis not present

## 2016-05-12 DIAGNOSIS — K8020 Calculus of gallbladder without cholecystitis without obstruction: Secondary | ICD-10-CM | POA: Diagnosis not present

## 2016-05-12 DIAGNOSIS — D509 Iron deficiency anemia, unspecified: Secondary | ICD-10-CM | POA: Diagnosis not present

## 2016-05-12 DIAGNOSIS — M150 Primary generalized (osteo)arthritis: Secondary | ICD-10-CM | POA: Diagnosis not present

## 2016-05-12 DIAGNOSIS — I4892 Unspecified atrial flutter: Secondary | ICD-10-CM | POA: Diagnosis not present

## 2016-05-12 DIAGNOSIS — I714 Abdominal aortic aneurysm, without rupture: Secondary | ICD-10-CM | POA: Diagnosis not present

## 2016-05-12 DIAGNOSIS — R569 Unspecified convulsions: Secondary | ICD-10-CM | POA: Diagnosis not present

## 2016-05-12 DIAGNOSIS — E7800 Pure hypercholesterolemia, unspecified: Secondary | ICD-10-CM | POA: Diagnosis not present

## 2016-05-27 DIAGNOSIS — M810 Age-related osteoporosis without current pathological fracture: Secondary | ICD-10-CM | POA: Diagnosis not present

## 2016-06-04 DIAGNOSIS — I340 Nonrheumatic mitral (valve) insufficiency: Secondary | ICD-10-CM | POA: Diagnosis not present

## 2016-06-04 DIAGNOSIS — R0602 Shortness of breath: Secondary | ICD-10-CM | POA: Diagnosis not present

## 2016-06-04 DIAGNOSIS — I480 Paroxysmal atrial fibrillation: Secondary | ICD-10-CM | POA: Diagnosis not present

## 2016-06-04 DIAGNOSIS — I714 Abdominal aortic aneurysm, without rupture: Secondary | ICD-10-CM | POA: Diagnosis not present

## 2016-06-04 DIAGNOSIS — I4892 Unspecified atrial flutter: Secondary | ICD-10-CM | POA: Diagnosis not present

## 2016-06-04 DIAGNOSIS — E7800 Pure hypercholesterolemia, unspecified: Secondary | ICD-10-CM | POA: Diagnosis not present

## 2016-06-12 DIAGNOSIS — R0602 Shortness of breath: Secondary | ICD-10-CM | POA: Diagnosis not present

## 2016-06-12 DIAGNOSIS — I4892 Unspecified atrial flutter: Secondary | ICD-10-CM | POA: Diagnosis not present

## 2016-06-12 DIAGNOSIS — I714 Abdominal aortic aneurysm, without rupture: Secondary | ICD-10-CM | POA: Diagnosis not present

## 2016-06-30 DIAGNOSIS — R0602 Shortness of breath: Secondary | ICD-10-CM | POA: Diagnosis not present

## 2016-06-30 DIAGNOSIS — I4892 Unspecified atrial flutter: Secondary | ICD-10-CM | POA: Diagnosis not present

## 2016-06-30 DIAGNOSIS — I714 Abdominal aortic aneurysm, without rupture: Secondary | ICD-10-CM | POA: Diagnosis not present

## 2016-06-30 DIAGNOSIS — E7800 Pure hypercholesterolemia, unspecified: Secondary | ICD-10-CM | POA: Diagnosis not present

## 2016-06-30 DIAGNOSIS — I340 Nonrheumatic mitral (valve) insufficiency: Secondary | ICD-10-CM | POA: Diagnosis not present

## 2016-07-07 DIAGNOSIS — I714 Abdominal aortic aneurysm, without rupture: Secondary | ICD-10-CM | POA: Diagnosis not present

## 2016-07-07 DIAGNOSIS — E7800 Pure hypercholesterolemia, unspecified: Secondary | ICD-10-CM | POA: Diagnosis not present

## 2016-07-07 DIAGNOSIS — R0602 Shortness of breath: Secondary | ICD-10-CM | POA: Diagnosis not present

## 2016-07-07 DIAGNOSIS — I4892 Unspecified atrial flutter: Secondary | ICD-10-CM | POA: Diagnosis not present

## 2016-07-21 DIAGNOSIS — I714 Abdominal aortic aneurysm, without rupture: Secondary | ICD-10-CM | POA: Diagnosis not present

## 2016-07-21 DIAGNOSIS — I4891 Unspecified atrial fibrillation: Secondary | ICD-10-CM | POA: Diagnosis not present

## 2016-07-21 DIAGNOSIS — I480 Paroxysmal atrial fibrillation: Secondary | ICD-10-CM | POA: Diagnosis not present

## 2016-07-21 DIAGNOSIS — S22059A Unspecified fracture of T5-T6 vertebra, initial encounter for closed fracture: Secondary | ICD-10-CM | POA: Diagnosis not present

## 2016-07-21 DIAGNOSIS — R5383 Other fatigue: Secondary | ICD-10-CM | POA: Diagnosis not present

## 2016-07-21 DIAGNOSIS — J811 Chronic pulmonary edema: Secondary | ICD-10-CM | POA: Diagnosis not present

## 2016-07-21 DIAGNOSIS — J90 Pleural effusion, not elsewhere classified: Secondary | ICD-10-CM | POA: Diagnosis not present

## 2016-07-21 DIAGNOSIS — Z45018 Encounter for adjustment and management of other part of cardiac pacemaker: Secondary | ICD-10-CM | POA: Diagnosis not present

## 2016-07-21 DIAGNOSIS — I481 Persistent atrial fibrillation: Secondary | ICD-10-CM | POA: Diagnosis not present

## 2016-07-21 DIAGNOSIS — I272 Pulmonary hypertension, unspecified: Secondary | ICD-10-CM | POA: Diagnosis not present

## 2016-07-21 DIAGNOSIS — M810 Age-related osteoporosis without current pathological fracture: Secondary | ICD-10-CM | POA: Diagnosis not present

## 2016-07-21 DIAGNOSIS — I484 Atypical atrial flutter: Secondary | ICD-10-CM | POA: Diagnosis not present

## 2016-07-21 DIAGNOSIS — R918 Other nonspecific abnormal finding of lung field: Secondary | ICD-10-CM | POA: Diagnosis not present

## 2016-07-21 DIAGNOSIS — I420 Dilated cardiomyopathy: Secondary | ICD-10-CM | POA: Diagnosis not present

## 2016-07-21 DIAGNOSIS — I5022 Chronic systolic (congestive) heart failure: Secondary | ICD-10-CM | POA: Diagnosis not present

## 2016-07-21 DIAGNOSIS — I517 Cardiomegaly: Secondary | ICD-10-CM | POA: Diagnosis not present

## 2016-07-21 DIAGNOSIS — M1991 Primary osteoarthritis, unspecified site: Secondary | ICD-10-CM | POA: Diagnosis not present

## 2016-07-21 DIAGNOSIS — R000 Tachycardia, unspecified: Secondary | ICD-10-CM | POA: Diagnosis not present

## 2016-07-21 DIAGNOSIS — I471 Supraventricular tachycardia: Secondary | ICD-10-CM | POA: Diagnosis not present

## 2016-07-21 DIAGNOSIS — I340 Nonrheumatic mitral (valve) insufficiency: Secondary | ICD-10-CM | POA: Diagnosis not present

## 2016-07-21 DIAGNOSIS — Z950 Presence of cardiac pacemaker: Secondary | ICD-10-CM | POA: Diagnosis not present

## 2016-07-21 DIAGNOSIS — M4854XA Collapsed vertebra, not elsewhere classified, thoracic region, initial encounter for fracture: Secondary | ICD-10-CM | POA: Diagnosis not present

## 2016-07-21 DIAGNOSIS — I4892 Unspecified atrial flutter: Secondary | ICD-10-CM | POA: Diagnosis not present

## 2016-07-21 DIAGNOSIS — I442 Atrioventricular block, complete: Secondary | ICD-10-CM | POA: Diagnosis not present

## 2016-07-21 DIAGNOSIS — R011 Cardiac murmur, unspecified: Secondary | ICD-10-CM | POA: Diagnosis not present

## 2016-07-21 DIAGNOSIS — D1803 Hemangioma of intra-abdominal structures: Secondary | ICD-10-CM | POA: Diagnosis not present

## 2016-07-22 DIAGNOSIS — I471 Supraventricular tachycardia: Secondary | ICD-10-CM | POA: Diagnosis not present

## 2016-07-22 DIAGNOSIS — R5383 Other fatigue: Secondary | ICD-10-CM | POA: Diagnosis not present

## 2016-07-22 DIAGNOSIS — I272 Pulmonary hypertension, unspecified: Secondary | ICD-10-CM | POA: Diagnosis not present

## 2016-07-22 DIAGNOSIS — I4892 Unspecified atrial flutter: Secondary | ICD-10-CM | POA: Diagnosis not present

## 2016-07-22 DIAGNOSIS — I714 Abdominal aortic aneurysm, without rupture: Secondary | ICD-10-CM | POA: Diagnosis not present

## 2016-07-22 DIAGNOSIS — I481 Persistent atrial fibrillation: Secondary | ICD-10-CM | POA: Diagnosis not present

## 2016-07-22 DIAGNOSIS — I5022 Chronic systolic (congestive) heart failure: Secondary | ICD-10-CM | POA: Diagnosis not present

## 2016-07-22 DIAGNOSIS — M810 Age-related osteoporosis without current pathological fracture: Secondary | ICD-10-CM | POA: Diagnosis not present

## 2016-07-22 DIAGNOSIS — I340 Nonrheumatic mitral (valve) insufficiency: Secondary | ICD-10-CM | POA: Diagnosis not present

## 2016-07-22 DIAGNOSIS — I4891 Unspecified atrial fibrillation: Secondary | ICD-10-CM | POA: Diagnosis not present

## 2016-07-22 DIAGNOSIS — R011 Cardiac murmur, unspecified: Secondary | ICD-10-CM | POA: Diagnosis not present

## 2016-07-22 DIAGNOSIS — Z950 Presence of cardiac pacemaker: Secondary | ICD-10-CM | POA: Diagnosis not present

## 2016-07-22 DIAGNOSIS — I442 Atrioventricular block, complete: Secondary | ICD-10-CM | POA: Diagnosis not present

## 2016-07-23 DIAGNOSIS — I471 Supraventricular tachycardia: Secondary | ICD-10-CM | POA: Diagnosis not present

## 2016-07-23 DIAGNOSIS — I272 Pulmonary hypertension, unspecified: Secondary | ICD-10-CM | POA: Diagnosis not present

## 2016-07-23 DIAGNOSIS — R000 Tachycardia, unspecified: Secondary | ICD-10-CM | POA: Diagnosis not present

## 2016-07-23 DIAGNOSIS — I420 Dilated cardiomyopathy: Secondary | ICD-10-CM | POA: Diagnosis not present

## 2016-07-23 DIAGNOSIS — J90 Pleural effusion, not elsewhere classified: Secondary | ICD-10-CM | POA: Diagnosis not present

## 2016-07-23 DIAGNOSIS — R011 Cardiac murmur, unspecified: Secondary | ICD-10-CM | POA: Diagnosis not present

## 2016-07-23 DIAGNOSIS — I517 Cardiomegaly: Secondary | ICD-10-CM | POA: Diagnosis not present

## 2016-07-23 DIAGNOSIS — I484 Atypical atrial flutter: Secondary | ICD-10-CM | POA: Diagnosis not present

## 2016-07-23 DIAGNOSIS — I714 Abdominal aortic aneurysm, without rupture: Secondary | ICD-10-CM | POA: Diagnosis not present

## 2016-07-23 DIAGNOSIS — I340 Nonrheumatic mitral (valve) insufficiency: Secondary | ICD-10-CM | POA: Diagnosis not present

## 2016-07-23 DIAGNOSIS — R5383 Other fatigue: Secondary | ICD-10-CM | POA: Diagnosis not present

## 2016-07-23 DIAGNOSIS — I5022 Chronic systolic (congestive) heart failure: Secondary | ICD-10-CM | POA: Diagnosis not present

## 2016-07-23 DIAGNOSIS — I4892 Unspecified atrial flutter: Secondary | ICD-10-CM | POA: Diagnosis not present

## 2016-07-23 DIAGNOSIS — Z950 Presence of cardiac pacemaker: Secondary | ICD-10-CM | POA: Diagnosis not present

## 2016-07-23 DIAGNOSIS — Z45018 Encounter for adjustment and management of other part of cardiac pacemaker: Secondary | ICD-10-CM | POA: Diagnosis not present

## 2016-07-23 DIAGNOSIS — R918 Other nonspecific abnormal finding of lung field: Secondary | ICD-10-CM | POA: Diagnosis not present

## 2016-07-23 DIAGNOSIS — I481 Persistent atrial fibrillation: Secondary | ICD-10-CM | POA: Diagnosis not present

## 2016-08-05 DIAGNOSIS — I4892 Unspecified atrial flutter: Secondary | ICD-10-CM | POA: Diagnosis not present

## 2016-08-05 DIAGNOSIS — D692 Other nonthrombocytopenic purpura: Secondary | ICD-10-CM | POA: Diagnosis not present

## 2016-08-05 DIAGNOSIS — R569 Unspecified convulsions: Secondary | ICD-10-CM | POA: Diagnosis not present

## 2016-08-05 DIAGNOSIS — D702 Other drug-induced agranulocytosis: Secondary | ICD-10-CM | POA: Diagnosis not present

## 2016-08-05 DIAGNOSIS — I714 Abdominal aortic aneurysm, without rupture: Secondary | ICD-10-CM | POA: Diagnosis not present

## 2016-08-05 DIAGNOSIS — D509 Iron deficiency anemia, unspecified: Secondary | ICD-10-CM | POA: Diagnosis not present

## 2016-08-05 DIAGNOSIS — I5022 Chronic systolic (congestive) heart failure: Secondary | ICD-10-CM | POA: Diagnosis not present

## 2016-08-05 DIAGNOSIS — E7800 Pure hypercholesterolemia, unspecified: Secondary | ICD-10-CM | POA: Diagnosis not present

## 2016-08-05 DIAGNOSIS — M810 Age-related osteoporosis without current pathological fracture: Secondary | ICD-10-CM | POA: Diagnosis not present

## 2016-08-21 DIAGNOSIS — I714 Abdominal aortic aneurysm, without rupture: Secondary | ICD-10-CM | POA: Diagnosis not present

## 2016-08-21 DIAGNOSIS — Z9889 Other specified postprocedural states: Secondary | ICD-10-CM | POA: Diagnosis not present

## 2016-08-21 DIAGNOSIS — I4892 Unspecified atrial flutter: Secondary | ICD-10-CM | POA: Diagnosis not present

## 2016-08-21 DIAGNOSIS — I340 Nonrheumatic mitral (valve) insufficiency: Secondary | ICD-10-CM | POA: Diagnosis not present

## 2016-08-21 DIAGNOSIS — I480 Paroxysmal atrial fibrillation: Secondary | ICD-10-CM | POA: Diagnosis not present

## 2016-08-21 DIAGNOSIS — R0602 Shortness of breath: Secondary | ICD-10-CM | POA: Diagnosis not present

## 2016-08-21 DIAGNOSIS — E7800 Pure hypercholesterolemia, unspecified: Secondary | ICD-10-CM | POA: Diagnosis not present

## 2016-08-21 DIAGNOSIS — Z950 Presence of cardiac pacemaker: Secondary | ICD-10-CM | POA: Diagnosis not present

## 2016-09-09 DIAGNOSIS — M810 Age-related osteoporosis without current pathological fracture: Secondary | ICD-10-CM | POA: Diagnosis not present

## 2016-09-09 DIAGNOSIS — E876 Hypokalemia: Secondary | ICD-10-CM | POA: Diagnosis not present

## 2016-09-09 DIAGNOSIS — E7800 Pure hypercholesterolemia, unspecified: Secondary | ICD-10-CM | POA: Diagnosis not present

## 2016-09-09 DIAGNOSIS — M150 Primary generalized (osteo)arthritis: Secondary | ICD-10-CM | POA: Diagnosis not present

## 2016-09-09 DIAGNOSIS — R569 Unspecified convulsions: Secondary | ICD-10-CM | POA: Diagnosis not present

## 2016-09-09 DIAGNOSIS — D702 Other drug-induced agranulocytosis: Secondary | ICD-10-CM | POA: Diagnosis not present

## 2016-09-09 DIAGNOSIS — D509 Iron deficiency anemia, unspecified: Secondary | ICD-10-CM | POA: Diagnosis not present

## 2016-09-09 DIAGNOSIS — I5022 Chronic systolic (congestive) heart failure: Secondary | ICD-10-CM | POA: Diagnosis not present

## 2016-09-09 DIAGNOSIS — Z950 Presence of cardiac pacemaker: Secondary | ICD-10-CM | POA: Diagnosis not present

## 2016-09-09 DIAGNOSIS — Z8781 Personal history of (healed) traumatic fracture: Secondary | ICD-10-CM | POA: Diagnosis not present

## 2016-09-11 DIAGNOSIS — R0602 Shortness of breath: Secondary | ICD-10-CM | POA: Diagnosis not present

## 2016-09-11 DIAGNOSIS — I714 Abdominal aortic aneurysm, without rupture: Secondary | ICD-10-CM | POA: Diagnosis not present

## 2016-09-11 DIAGNOSIS — Z9889 Other specified postprocedural states: Secondary | ICD-10-CM | POA: Diagnosis not present

## 2016-09-11 DIAGNOSIS — Z950 Presence of cardiac pacemaker: Secondary | ICD-10-CM | POA: Diagnosis not present

## 2016-09-11 DIAGNOSIS — I340 Nonrheumatic mitral (valve) insufficiency: Secondary | ICD-10-CM | POA: Diagnosis not present

## 2016-09-11 DIAGNOSIS — E7800 Pure hypercholesterolemia, unspecified: Secondary | ICD-10-CM | POA: Diagnosis not present

## 2016-09-11 DIAGNOSIS — I4892 Unspecified atrial flutter: Secondary | ICD-10-CM | POA: Diagnosis not present

## 2016-10-20 DIAGNOSIS — Z961 Presence of intraocular lens: Secondary | ICD-10-CM | POA: Diagnosis not present

## 2016-10-23 ENCOUNTER — Ambulatory Visit
Admission: RE | Admit: 2016-10-23 | Discharge: 2016-10-23 | Disposition: A | Discharge status: Home / Self Care | Payer: Commercial Managed Care - HMO | Source: Ambulatory Visit | Attending: Internal Medicine | Admitting: Internal Medicine

## 2016-10-23 DIAGNOSIS — Z1231 Encounter for screening mammogram for malignant neoplasm of breast: Secondary | ICD-10-CM | POA: Diagnosis not present

## 2016-12-22 DIAGNOSIS — M25522 Pain in left elbow: Secondary | ICD-10-CM | POA: Diagnosis not present

## 2016-12-22 DIAGNOSIS — M19022 Primary osteoarthritis, left elbow: Secondary | ICD-10-CM | POA: Diagnosis not present

## 2017-01-11 DIAGNOSIS — I4892 Unspecified atrial flutter: Secondary | ICD-10-CM | POA: Diagnosis not present

## 2017-01-11 DIAGNOSIS — E7800 Pure hypercholesterolemia, unspecified: Secondary | ICD-10-CM | POA: Diagnosis not present

## 2017-01-11 DIAGNOSIS — Z9889 Other specified postprocedural states: Secondary | ICD-10-CM | POA: Diagnosis not present

## 2017-01-11 DIAGNOSIS — I340 Nonrheumatic mitral (valve) insufficiency: Secondary | ICD-10-CM | POA: Diagnosis not present

## 2017-01-11 DIAGNOSIS — R0602 Shortness of breath: Secondary | ICD-10-CM | POA: Diagnosis not present

## 2017-01-11 DIAGNOSIS — I714 Abdominal aortic aneurysm, without rupture: Secondary | ICD-10-CM | POA: Diagnosis not present

## 2017-01-11 DIAGNOSIS — I5022 Chronic systolic (congestive) heart failure: Secondary | ICD-10-CM | POA: Diagnosis not present

## 2017-01-11 DIAGNOSIS — Z950 Presence of cardiac pacemaker: Secondary | ICD-10-CM | POA: Diagnosis not present

## 2017-01-21 DIAGNOSIS — I495 Sick sinus syndrome: Secondary | ICD-10-CM | POA: Diagnosis not present

## 2017-02-23 DIAGNOSIS — E7800 Pure hypercholesterolemia, unspecified: Secondary | ICD-10-CM | POA: Diagnosis not present

## 2017-02-23 DIAGNOSIS — E876 Hypokalemia: Secondary | ICD-10-CM | POA: Diagnosis not present

## 2017-02-23 DIAGNOSIS — I5022 Chronic systolic (congestive) heart failure: Secondary | ICD-10-CM | POA: Diagnosis not present

## 2017-02-23 DIAGNOSIS — D509 Iron deficiency anemia, unspecified: Secondary | ICD-10-CM | POA: Diagnosis not present

## 2017-02-23 DIAGNOSIS — D702 Other drug-induced agranulocytosis: Secondary | ICD-10-CM | POA: Diagnosis not present

## 2017-02-26 DIAGNOSIS — L03116 Cellulitis of left lower limb: Secondary | ICD-10-CM | POA: Diagnosis not present

## 2017-03-02 DIAGNOSIS — R569 Unspecified convulsions: Secondary | ICD-10-CM | POA: Diagnosis not present

## 2017-03-02 DIAGNOSIS — D692 Other nonthrombocytopenic purpura: Secondary | ICD-10-CM | POA: Diagnosis not present

## 2017-03-02 DIAGNOSIS — I714 Abdominal aortic aneurysm, without rupture: Secondary | ICD-10-CM | POA: Diagnosis not present

## 2017-03-02 DIAGNOSIS — Z23 Encounter for immunization: Secondary | ICD-10-CM | POA: Diagnosis not present

## 2017-03-02 DIAGNOSIS — I4892 Unspecified atrial flutter: Secondary | ICD-10-CM | POA: Diagnosis not present

## 2017-03-02 DIAGNOSIS — I5022 Chronic systolic (congestive) heart failure: Secondary | ICD-10-CM | POA: Diagnosis not present

## 2017-03-02 DIAGNOSIS — Z0000 Encounter for general adult medical examination without abnormal findings: Secondary | ICD-10-CM | POA: Diagnosis not present

## 2017-03-02 DIAGNOSIS — D509 Iron deficiency anemia, unspecified: Secondary | ICD-10-CM | POA: Diagnosis not present

## 2017-03-02 DIAGNOSIS — I340 Nonrheumatic mitral (valve) insufficiency: Secondary | ICD-10-CM | POA: Diagnosis not present

## 2017-03-02 DIAGNOSIS — M810 Age-related osteoporosis without current pathological fracture: Secondary | ICD-10-CM | POA: Diagnosis not present

## 2017-03-02 DIAGNOSIS — E7800 Pure hypercholesterolemia, unspecified: Secondary | ICD-10-CM | POA: Diagnosis not present

## 2017-03-03 ENCOUNTER — Other Ambulatory Visit: Payer: Self-pay | Admitting: Internal Medicine

## 2017-03-03 DIAGNOSIS — I714 Abdominal aortic aneurysm, without rupture, unspecified: Secondary | ICD-10-CM

## 2017-03-11 IMAGING — CR DG CHEST 1V PORT
1 series · 1 of 1 positions shown · non-contrast
Comparison: None.

CLINICAL DATA: Shortness of breath.

EXAM:
PORTABLE CHEST - 1 VIEW

[ap]
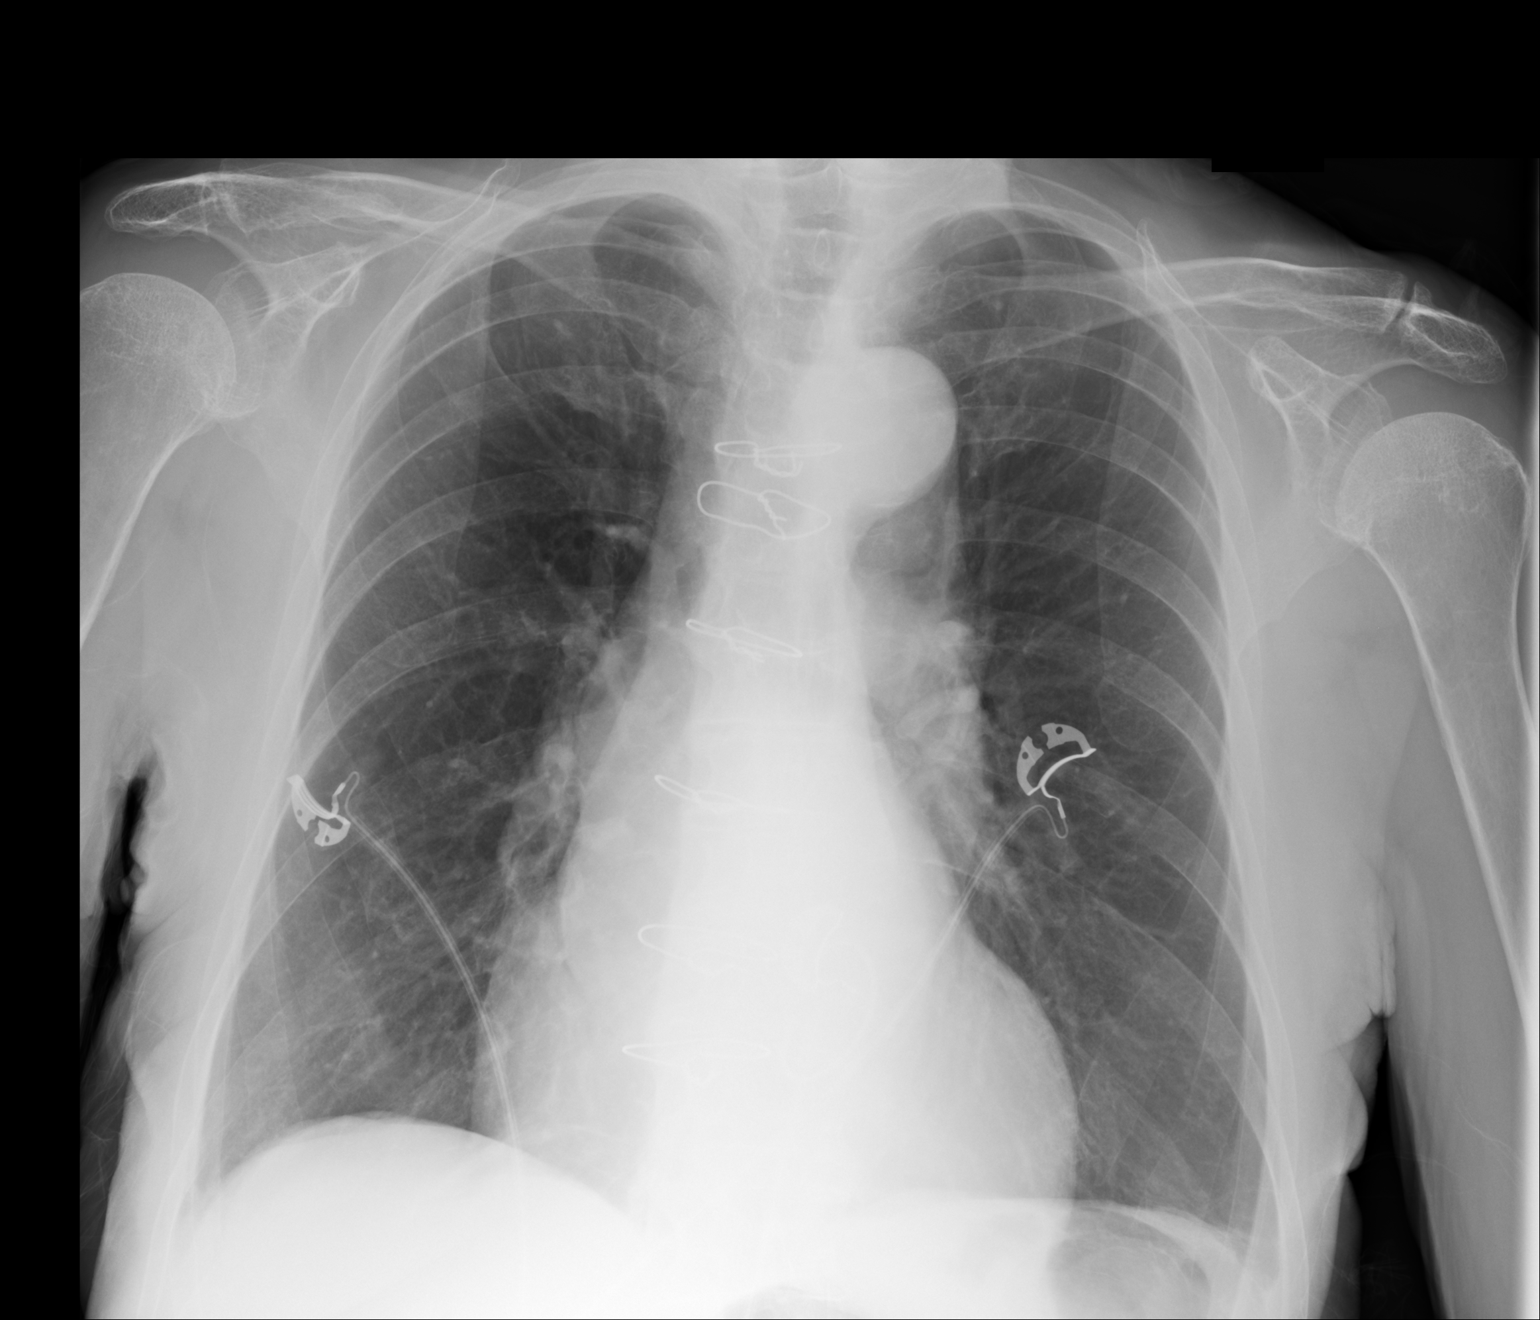

[1 of 1 positions shown; findings below may reference images not displayed]

FINDINGS: The heart size and mediastinal contours are within normal limits.
Both lungs are clear. Sternotomy wires are noted. No pneumothorax or
pleural effusion is noted. The visualized skeletal structures are
unremarkable.
IMPRESSION: No acute cardiopulmonary abnormality seen.

## 2017-03-15 DIAGNOSIS — I714 Abdominal aortic aneurysm, without rupture: Secondary | ICD-10-CM | POA: Diagnosis not present

## 2017-03-15 DIAGNOSIS — E7800 Pure hypercholesterolemia, unspecified: Secondary | ICD-10-CM | POA: Diagnosis not present

## 2017-03-15 DIAGNOSIS — I5022 Chronic systolic (congestive) heart failure: Secondary | ICD-10-CM | POA: Diagnosis not present

## 2017-03-15 DIAGNOSIS — I4892 Unspecified atrial flutter: Secondary | ICD-10-CM | POA: Diagnosis not present

## 2017-03-15 DIAGNOSIS — Z950 Presence of cardiac pacemaker: Secondary | ICD-10-CM | POA: Diagnosis not present

## 2017-03-15 DIAGNOSIS — D702 Other drug-induced agranulocytosis: Secondary | ICD-10-CM | POA: Diagnosis not present

## 2017-07-15 ENCOUNTER — Other Ambulatory Visit: Payer: Self-pay | Admitting: Cardiology

## 2017-07-15 DIAGNOSIS — I714 Abdominal aortic aneurysm, without rupture, unspecified: Secondary | ICD-10-CM

## 2017-07-15 DIAGNOSIS — E7800 Pure hypercholesterolemia, unspecified: Secondary | ICD-10-CM | POA: Diagnosis not present

## 2017-07-15 DIAGNOSIS — I340 Nonrheumatic mitral (valve) insufficiency: Secondary | ICD-10-CM | POA: Diagnosis not present

## 2017-07-15 DIAGNOSIS — Z9889 Other specified postprocedural states: Secondary | ICD-10-CM | POA: Diagnosis not present

## 2017-07-15 DIAGNOSIS — R0602 Shortness of breath: Secondary | ICD-10-CM | POA: Diagnosis not present

## 2017-07-15 DIAGNOSIS — I5022 Chronic systolic (congestive) heart failure: Secondary | ICD-10-CM | POA: Diagnosis not present

## 2017-07-15 DIAGNOSIS — I4892 Unspecified atrial flutter: Secondary | ICD-10-CM | POA: Diagnosis not present

## 2017-07-15 DIAGNOSIS — Z950 Presence of cardiac pacemaker: Secondary | ICD-10-CM | POA: Diagnosis not present

## 2017-07-20 ENCOUNTER — Ambulatory Visit
Admission: RE | Admit: 2017-07-20 | Discharge: 2017-07-20 | Disposition: A | Discharge status: Home / Self Care | Payer: Medicare HMO | Source: Ambulatory Visit | Attending: Cardiology | Admitting: Cardiology

## 2017-07-20 DIAGNOSIS — I714 Abdominal aortic aneurysm, without rupture, unspecified: Secondary | ICD-10-CM

## 2017-07-20 DIAGNOSIS — I495 Sick sinus syndrome: Secondary | ICD-10-CM | POA: Diagnosis not present

## 2017-07-20 DIAGNOSIS — R160 Hepatomegaly, not elsewhere classified: Secondary | ICD-10-CM | POA: Insufficient documentation

## 2017-07-20 DIAGNOSIS — K8020 Calculus of gallbladder without cholecystitis without obstruction: Secondary | ICD-10-CM | POA: Diagnosis not present

## 2017-07-20 DIAGNOSIS — R932 Abnormal findings on diagnostic imaging of liver and biliary tract: Secondary | ICD-10-CM | POA: Insufficient documentation

## 2017-07-23 DIAGNOSIS — I340 Nonrheumatic mitral (valve) insufficiency: Secondary | ICD-10-CM | POA: Diagnosis not present

## 2017-07-23 DIAGNOSIS — I5022 Chronic systolic (congestive) heart failure: Secondary | ICD-10-CM | POA: Diagnosis not present

## 2017-07-23 DIAGNOSIS — R0602 Shortness of breath: Secondary | ICD-10-CM | POA: Diagnosis not present

## 2017-07-23 DIAGNOSIS — Z950 Presence of cardiac pacemaker: Secondary | ICD-10-CM | POA: Diagnosis not present

## 2017-09-03 DIAGNOSIS — Z4421 Encounter for fitting and adjustment of artificial right eye: Secondary | ICD-10-CM | POA: Diagnosis not present

## 2017-09-06 DIAGNOSIS — E7800 Pure hypercholesterolemia, unspecified: Secondary | ICD-10-CM | POA: Diagnosis not present

## 2017-09-06 DIAGNOSIS — I4892 Unspecified atrial flutter: Secondary | ICD-10-CM | POA: Diagnosis not present

## 2017-09-06 DIAGNOSIS — I5022 Chronic systolic (congestive) heart failure: Secondary | ICD-10-CM | POA: Diagnosis not present

## 2017-09-06 DIAGNOSIS — I714 Abdominal aortic aneurysm, without rupture: Secondary | ICD-10-CM | POA: Diagnosis not present

## 2017-09-13 DIAGNOSIS — D692 Other nonthrombocytopenic purpura: Secondary | ICD-10-CM | POA: Diagnosis not present

## 2017-09-13 DIAGNOSIS — R569 Unspecified convulsions: Secondary | ICD-10-CM | POA: Diagnosis not present

## 2017-09-13 DIAGNOSIS — I714 Abdominal aortic aneurysm, without rupture: Secondary | ICD-10-CM | POA: Diagnosis not present

## 2017-09-13 DIAGNOSIS — D509 Iron deficiency anemia, unspecified: Secondary | ICD-10-CM | POA: Diagnosis not present

## 2017-09-13 DIAGNOSIS — I5022 Chronic systolic (congestive) heart failure: Secondary | ICD-10-CM | POA: Diagnosis not present

## 2017-09-13 DIAGNOSIS — M810 Age-related osteoporosis without current pathological fracture: Secondary | ICD-10-CM | POA: Diagnosis not present

## 2017-09-13 DIAGNOSIS — I4892 Unspecified atrial flutter: Secondary | ICD-10-CM | POA: Diagnosis not present

## 2017-09-13 DIAGNOSIS — E7800 Pure hypercholesterolemia, unspecified: Secondary | ICD-10-CM | POA: Diagnosis not present

## 2017-09-13 DIAGNOSIS — D702 Other drug-induced agranulocytosis: Secondary | ICD-10-CM | POA: Diagnosis not present

## 2017-09-17 ENCOUNTER — Other Ambulatory Visit: Payer: Self-pay | Admitting: Internal Medicine

## 2017-09-17 DIAGNOSIS — R160 Hepatomegaly, not elsewhere classified: Secondary | ICD-10-CM

## 2017-09-28 ENCOUNTER — Ambulatory Visit
Admission: RE | Admit: 2017-09-28 | Discharge: 2017-09-28 | Disposition: A | Discharge status: Home / Self Care | Payer: Medicare HMO | Source: Ambulatory Visit | Attending: Internal Medicine | Admitting: Internal Medicine

## 2017-09-28 DIAGNOSIS — K8020 Calculus of gallbladder without cholecystitis without obstruction: Secondary | ICD-10-CM | POA: Insufficient documentation

## 2017-09-28 DIAGNOSIS — N200 Calculus of kidney: Secondary | ICD-10-CM | POA: Diagnosis not present

## 2017-09-28 DIAGNOSIS — M4186 Other forms of scoliosis, lumbar region: Secondary | ICD-10-CM | POA: Diagnosis not present

## 2017-09-28 DIAGNOSIS — I517 Cardiomegaly: Secondary | ICD-10-CM | POA: Insufficient documentation

## 2017-09-28 DIAGNOSIS — I700 Atherosclerosis of aorta: Secondary | ICD-10-CM | POA: Diagnosis not present

## 2017-09-28 DIAGNOSIS — R160 Hepatomegaly, not elsewhere classified: Secondary | ICD-10-CM | POA: Diagnosis not present

## 2017-09-28 DIAGNOSIS — D734 Cyst of spleen: Secondary | ICD-10-CM | POA: Diagnosis not present

## 2017-09-28 HISTORY — DX: Heart failure, unspecified: I50.9

## 2017-09-28 MED ORDER — IOHEXOL 300 MG/ML  SOLN
100.0000 mL | Freq: Once | INTRAMUSCULAR | Status: AC | PRN
  Administered 2017-09-28: 100 mL via INTRAVENOUS

## 2017-10-22 ENCOUNTER — Other Ambulatory Visit: Payer: Self-pay | Admitting: Internal Medicine

## 2017-10-22 DIAGNOSIS — Z1231 Encounter for screening mammogram for malignant neoplasm of breast: Secondary | ICD-10-CM

## 2017-10-26 ENCOUNTER — Ambulatory Visit
Admission: RE | Admit: 2017-10-26 | Discharge: 2017-10-26 | Disposition: A | Discharge status: Home / Self Care | Payer: Medicare HMO | Source: Ambulatory Visit | Attending: Internal Medicine | Admitting: Internal Medicine

## 2017-10-26 DIAGNOSIS — Z1231 Encounter for screening mammogram for malignant neoplasm of breast: Secondary | ICD-10-CM | POA: Diagnosis not present

## 2017-11-16 DIAGNOSIS — I5022 Chronic systolic (congestive) heart failure: Secondary | ICD-10-CM | POA: Diagnosis not present

## 2017-11-16 DIAGNOSIS — I4892 Unspecified atrial flutter: Secondary | ICD-10-CM | POA: Diagnosis not present

## 2017-11-16 DIAGNOSIS — I714 Abdominal aortic aneurysm, without rupture: Secondary | ICD-10-CM | POA: Diagnosis not present

## 2017-11-16 DIAGNOSIS — E7800 Pure hypercholesterolemia, unspecified: Secondary | ICD-10-CM | POA: Diagnosis not present

## 2017-11-16 DIAGNOSIS — R0602 Shortness of breath: Secondary | ICD-10-CM | POA: Diagnosis not present

## 2017-11-16 DIAGNOSIS — Z9889 Other specified postprocedural states: Secondary | ICD-10-CM | POA: Diagnosis not present

## 2017-11-16 DIAGNOSIS — I340 Nonrheumatic mitral (valve) insufficiency: Secondary | ICD-10-CM | POA: Diagnosis not present

## 2017-11-18 IMAGING — US US RETROPERITONEAL COMPLETE
1 series · 13 of 25 positions shown · non-contrast
Comparison: None.

CLINICAL DATA: Evaluate for abdominal aortic aneurysm.

EXAM:
ULTRASOUND RETROPERITONEAL COMPLETE
TECHNIQUE: Ultrasound examination of the abdominal aorta was performed to
evaluate for abdominal aortic aneurysm. The common iliac arteries,
IVC, and kidneys were also evaluated.

[Series 1: us retroperitoneal complete · 0.22mm/px · 13 of 71 slices shown]
[im 1/71]
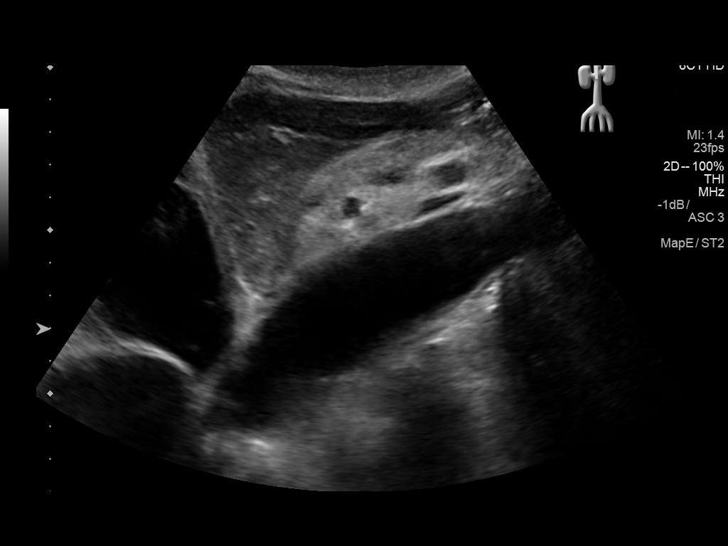
[im 6/71]
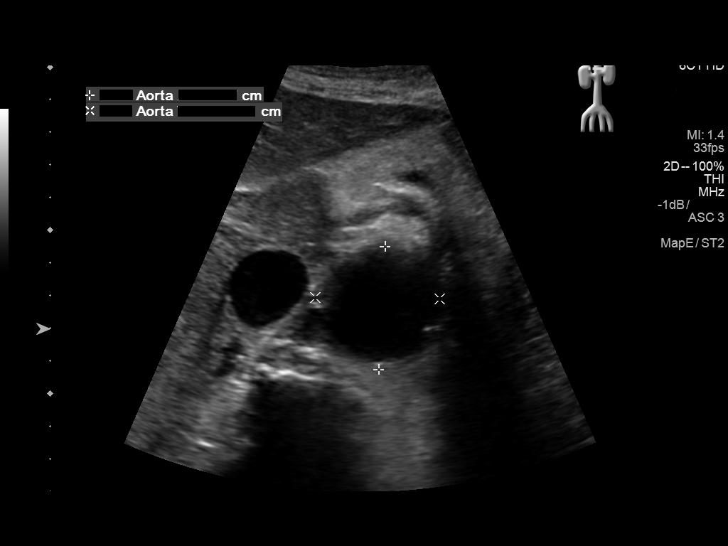
[im 12/71]
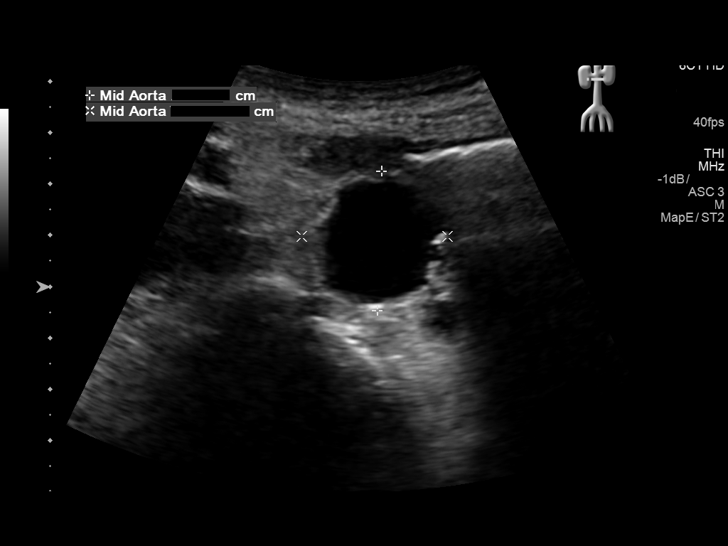
[im 18/71]
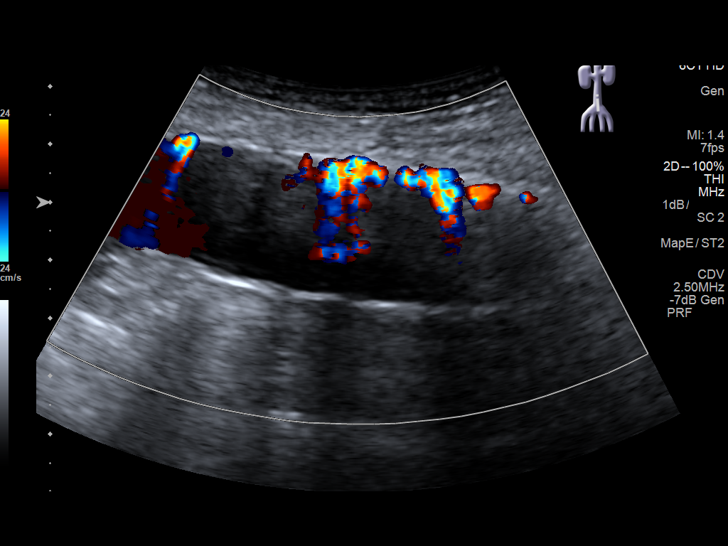
[im 24/71]
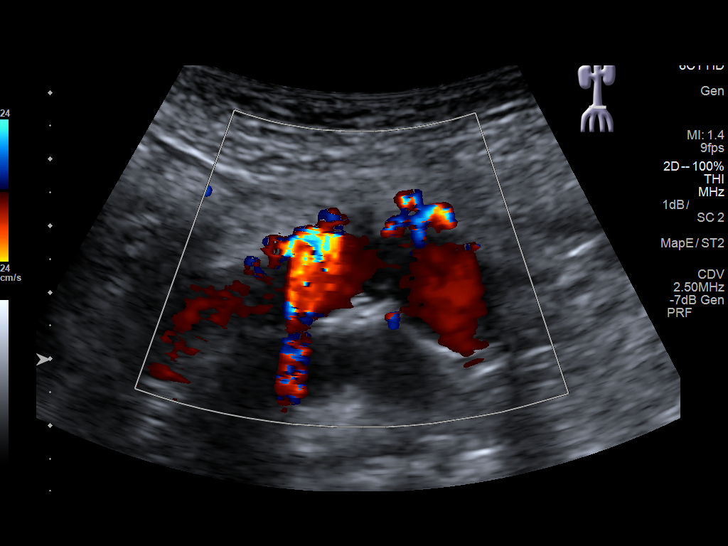
[im 30/71]
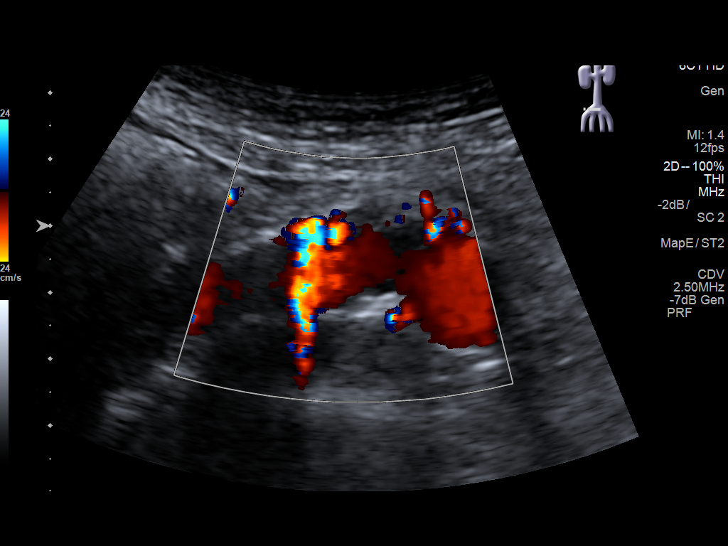
[im 36/71]
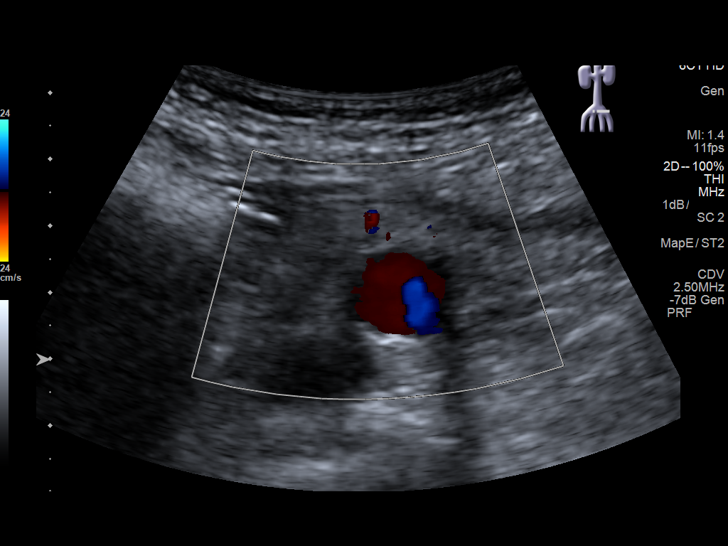
[im 41/71]
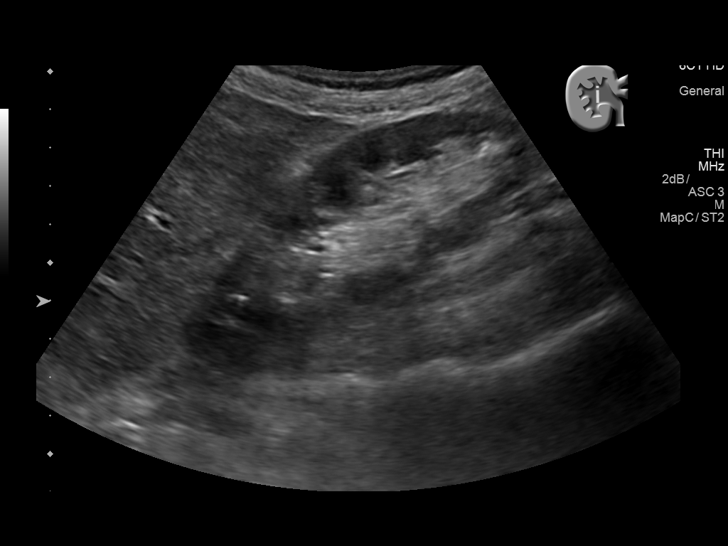
[im 47/71]
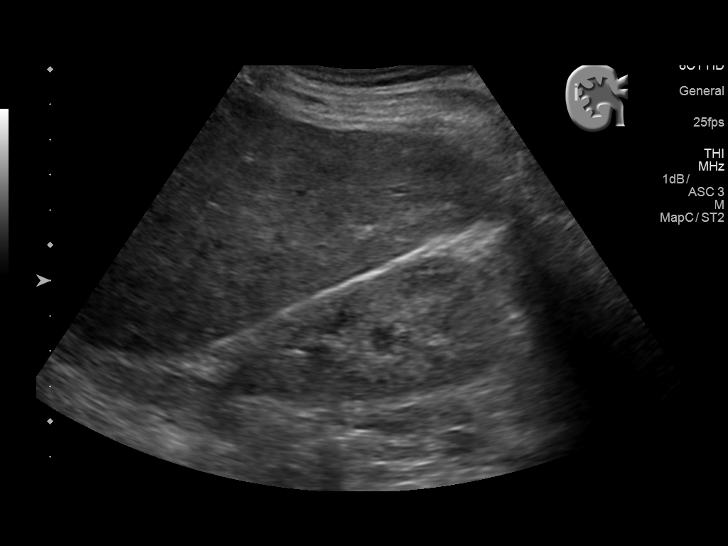
[im 53/71]
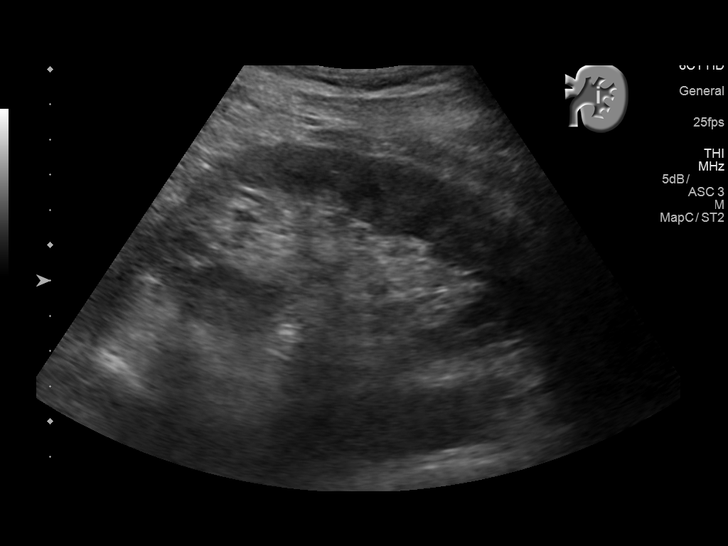
[im 59/71]
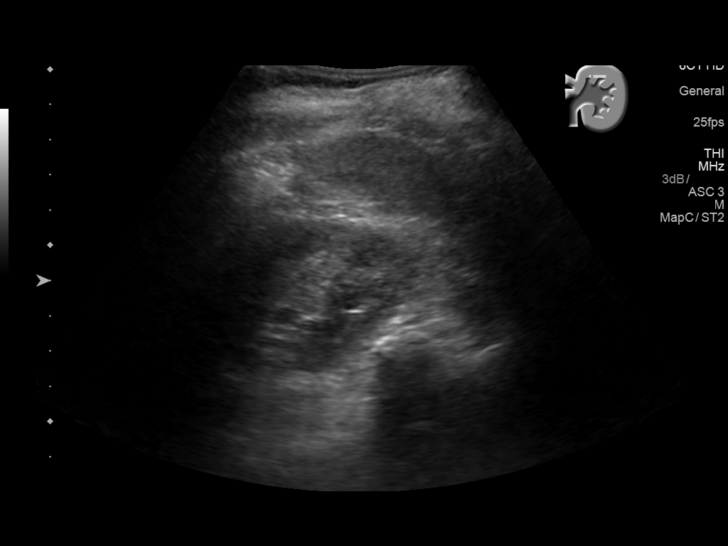
[im 65/71]
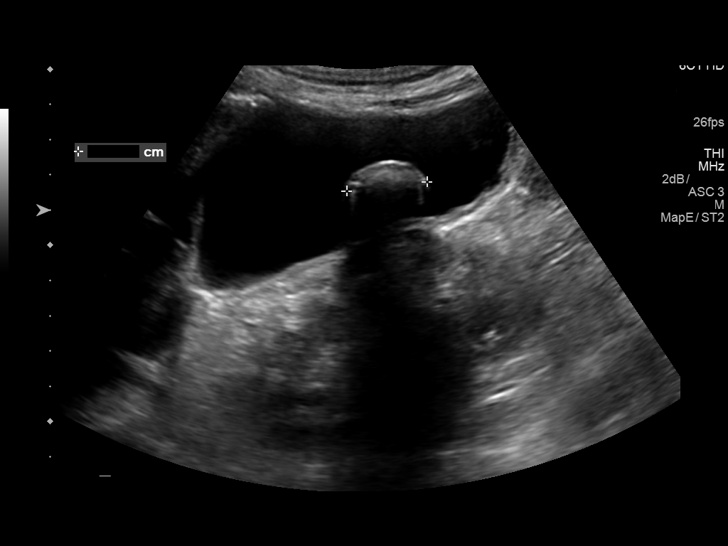
[im 71/71]
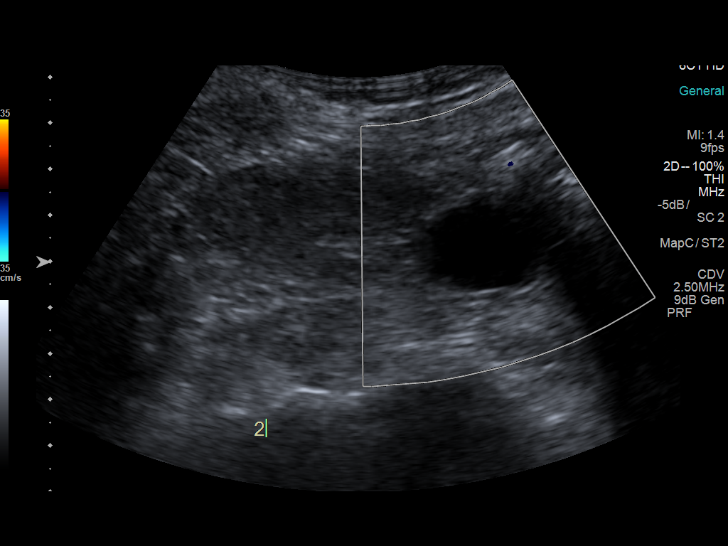

[13 of 25 positions shown; findings below may reference images not displayed]

FINDINGS: Abdominal Aorta

Enlargement of the proximal abdominal aorta measuring up to 3.8 cm.
Mid abdominal aorta has atherosclerotic disease with a maximum
diameter of 2.8 cm. Maximum diameter of the distal abdominal aorta
measures up to 2.6 cm.

Maximum AP

Diameter:  3.8 cm in the proximal abdominal aorta.

Maximum TRV

Diameter: 3.8 cm in the proximal abdominal aorta.

Right Common Iliac Artery

Measures up to 1.8 cm.

Left Common Iliac Artery

Measures up to 1.7 cm.

IVC

No abnormality visualized.

Right Kidney

Length: 10.3 cm. Echogenicity within normal limits. No mass or
hydronephrosis visualized.

Left Kidney

Length: 11.1 cm. Normal echogenicity without hydronephrosis.
Evidence for left renal cysts. Largest left renal cyst is anechoic
measuring up to 2.7 cm.

Other:  Large calcified gallstone measuring up to 2.3 cm.
IMPRESSION: Aneurysmal dilatation of the proximal abdominal aorta measuring up
to 3.8 cm. Recommend followup by ultrasound in 2 years. This
recommendation follows ACR consensus guidelines: White Paper of the
ACR Incidental Findings Committee II on Vascular Findings. [HOSPITAL] 0593; [DATE].

Aortic atherosclerosis.

Left renal cysts.

Cholelithiasis.

## 2018-01-25 DIAGNOSIS — I495 Sick sinus syndrome: Secondary | ICD-10-CM | POA: Diagnosis not present

## 2018-02-15 DIAGNOSIS — K006 Disturbances in tooth eruption: Secondary | ICD-10-CM | POA: Diagnosis not present

## 2018-02-25 DIAGNOSIS — R569 Unspecified convulsions: Secondary | ICD-10-CM | POA: Diagnosis not present

## 2018-02-25 DIAGNOSIS — D509 Iron deficiency anemia, unspecified: Secondary | ICD-10-CM | POA: Diagnosis not present

## 2018-02-25 DIAGNOSIS — D702 Other drug-induced agranulocytosis: Secondary | ICD-10-CM | POA: Diagnosis not present

## 2018-02-25 DIAGNOSIS — Z4420 Encounter for fitting and adjustment of artificial eye, unspecified: Secondary | ICD-10-CM | POA: Diagnosis not present

## 2018-02-25 DIAGNOSIS — I5022 Chronic systolic (congestive) heart failure: Secondary | ICD-10-CM | POA: Diagnosis not present

## 2018-02-25 DIAGNOSIS — R739 Hyperglycemia, unspecified: Secondary | ICD-10-CM | POA: Diagnosis not present

## 2018-02-25 DIAGNOSIS — E7800 Pure hypercholesterolemia, unspecified: Secondary | ICD-10-CM | POA: Diagnosis not present

## 2018-02-25 DIAGNOSIS — M810 Age-related osteoporosis without current pathological fracture: Secondary | ICD-10-CM | POA: Diagnosis not present

## 2018-03-01 DIAGNOSIS — K006 Disturbances in tooth eruption: Secondary | ICD-10-CM | POA: Diagnosis not present

## 2018-03-04 DIAGNOSIS — E7800 Pure hypercholesterolemia, unspecified: Secondary | ICD-10-CM | POA: Diagnosis not present

## 2018-03-04 DIAGNOSIS — D702 Other drug-induced agranulocytosis: Secondary | ICD-10-CM | POA: Diagnosis not present

## 2018-03-04 DIAGNOSIS — I5022 Chronic systolic (congestive) heart failure: Secondary | ICD-10-CM | POA: Diagnosis not present

## 2018-03-04 DIAGNOSIS — I714 Abdominal aortic aneurysm, without rupture: Secondary | ICD-10-CM | POA: Diagnosis not present

## 2018-03-04 DIAGNOSIS — I4892 Unspecified atrial flutter: Secondary | ICD-10-CM | POA: Diagnosis not present

## 2018-03-04 DIAGNOSIS — D692 Other nonthrombocytopenic purpura: Secondary | ICD-10-CM | POA: Diagnosis not present

## 2018-03-04 DIAGNOSIS — I340 Nonrheumatic mitral (valve) insufficiency: Secondary | ICD-10-CM | POA: Diagnosis not present

## 2018-03-04 DIAGNOSIS — Z0000 Encounter for general adult medical examination without abnormal findings: Secondary | ICD-10-CM | POA: Diagnosis not present

## 2018-03-04 DIAGNOSIS — M150 Primary generalized (osteo)arthritis: Secondary | ICD-10-CM | POA: Diagnosis not present

## 2018-03-04 DIAGNOSIS — Z23 Encounter for immunization: Secondary | ICD-10-CM | POA: Diagnosis not present

## 2018-03-07 DIAGNOSIS — M278 Other specified diseases of jaws: Secondary | ICD-10-CM | POA: Diagnosis not present

## 2018-03-07 DIAGNOSIS — K090 Developmental odontogenic cysts: Secondary | ICD-10-CM | POA: Diagnosis not present

## 2018-04-04 DIAGNOSIS — H02836 Dermatochalasis of left eye, unspecified eyelid: Secondary | ICD-10-CM | POA: Diagnosis not present

## 2018-04-08 DIAGNOSIS — D1722 Benign lipomatous neoplasm of skin and subcutaneous tissue of left arm: Secondary | ICD-10-CM | POA: Diagnosis not present

## 2018-05-19 DIAGNOSIS — I4892 Unspecified atrial flutter: Secondary | ICD-10-CM | POA: Diagnosis not present

## 2018-05-19 DIAGNOSIS — I5022 Chronic systolic (congestive) heart failure: Secondary | ICD-10-CM | POA: Diagnosis not present

## 2018-05-19 DIAGNOSIS — I340 Nonrheumatic mitral (valve) insufficiency: Secondary | ICD-10-CM | POA: Diagnosis not present

## 2018-08-18 DIAGNOSIS — I495 Sick sinus syndrome: Secondary | ICD-10-CM | POA: Diagnosis not present

## 2019-01-05 ENCOUNTER — Other Ambulatory Visit: Payer: Self-pay

## 2019-01-05 ENCOUNTER — Emergency Department: Payer: Medicare HMO

## 2019-01-05 ENCOUNTER — Inpatient Hospital Stay
Admission: EM | Admit: 2019-01-05 | Discharge: 2019-02-07 | DRG: 480 | Disposition: E | Payer: Medicare HMO | Attending: Internal Medicine | Admitting: Internal Medicine

## 2019-01-05 ENCOUNTER — Encounter: Payer: Self-pay | Admitting: Emergency Medicine

## 2019-01-05 DIAGNOSIS — Z66 Do not resuscitate: Secondary | ICD-10-CM | POA: Diagnosis present

## 2019-01-05 DIAGNOSIS — R4701 Aphasia: Secondary | ICD-10-CM | POA: Diagnosis not present

## 2019-01-05 DIAGNOSIS — Z419 Encounter for procedure for purposes other than remedying health state, unspecified: Secondary | ICD-10-CM

## 2019-01-05 DIAGNOSIS — Z79899 Other long term (current) drug therapy: Secondary | ICD-10-CM

## 2019-01-05 DIAGNOSIS — I4891 Unspecified atrial fibrillation: Secondary | ICD-10-CM | POA: Diagnosis not present

## 2019-01-05 DIAGNOSIS — I110 Hypertensive heart disease with heart failure: Secondary | ICD-10-CM | POA: Diagnosis present

## 2019-01-05 DIAGNOSIS — I509 Heart failure, unspecified: Secondary | ICD-10-CM | POA: Diagnosis not present

## 2019-01-05 DIAGNOSIS — S72141D Displaced intertrochanteric fracture of right femur, subsequent encounter for closed fracture with routine healing: Secondary | ICD-10-CM | POA: Diagnosis not present

## 2019-01-05 DIAGNOSIS — J90 Pleural effusion, not elsewhere classified: Secondary | ICD-10-CM | POA: Diagnosis not present

## 2019-01-05 DIAGNOSIS — S72001A Fracture of unspecified part of neck of right femur, initial encounter for closed fracture: Secondary | ICD-10-CM | POA: Diagnosis not present

## 2019-01-05 DIAGNOSIS — Y92017 Garden or yard in single-family (private) house as the place of occurrence of the external cause: Secondary | ICD-10-CM | POA: Diagnosis not present

## 2019-01-05 DIAGNOSIS — R29716 NIHSS score 16: Secondary | ICD-10-CM | POA: Diagnosis present

## 2019-01-05 DIAGNOSIS — Z7901 Long term (current) use of anticoagulants: Secondary | ICD-10-CM

## 2019-01-05 DIAGNOSIS — W19XXXA Unspecified fall, initial encounter: Secondary | ICD-10-CM | POA: Diagnosis not present

## 2019-01-05 DIAGNOSIS — I6601 Occlusion and stenosis of right middle cerebral artery: Secondary | ICD-10-CM | POA: Diagnosis not present

## 2019-01-05 DIAGNOSIS — G8194 Hemiplegia, unspecified affecting left nondominant side: Secondary | ICD-10-CM | POA: Diagnosis not present

## 2019-01-05 DIAGNOSIS — Z515 Encounter for palliative care: Secondary | ICD-10-CM | POA: Diagnosis not present

## 2019-01-05 DIAGNOSIS — N3000 Acute cystitis without hematuria: Secondary | ICD-10-CM | POA: Diagnosis not present

## 2019-01-05 DIAGNOSIS — I63411 Cerebral infarction due to embolism of right middle cerebral artery: Secondary | ICD-10-CM | POA: Diagnosis not present

## 2019-01-05 DIAGNOSIS — Z90710 Acquired absence of both cervix and uterus: Secondary | ICD-10-CM

## 2019-01-05 DIAGNOSIS — Z20828 Contact with and (suspected) exposure to other viral communicable diseases: Secondary | ICD-10-CM | POA: Diagnosis present

## 2019-01-05 DIAGNOSIS — Z03818 Encounter for observation for suspected exposure to other biological agents ruled out: Secondary | ICD-10-CM | POA: Diagnosis not present

## 2019-01-05 DIAGNOSIS — Y93H2 Activity, gardening and landscaping: Secondary | ICD-10-CM

## 2019-01-05 DIAGNOSIS — D649 Anemia, unspecified: Secondary | ICD-10-CM | POA: Diagnosis not present

## 2019-01-05 DIAGNOSIS — S72009A Fracture of unspecified part of neck of unspecified femur, initial encounter for closed fracture: Secondary | ICD-10-CM | POA: Diagnosis present

## 2019-01-05 DIAGNOSIS — M25551 Pain in right hip: Secondary | ICD-10-CM | POA: Diagnosis not present

## 2019-01-05 DIAGNOSIS — Z87891 Personal history of nicotine dependence: Secondary | ICD-10-CM | POA: Diagnosis not present

## 2019-01-05 DIAGNOSIS — W010XXA Fall on same level from slipping, tripping and stumbling without subsequent striking against object, initial encounter: Secondary | ICD-10-CM | POA: Diagnosis present

## 2019-01-05 DIAGNOSIS — M810 Age-related osteoporosis without current pathological fracture: Secondary | ICD-10-CM | POA: Diagnosis present

## 2019-01-05 DIAGNOSIS — G40909 Epilepsy, unspecified, not intractable, without status epilepticus: Secondary | ICD-10-CM | POA: Diagnosis present

## 2019-01-05 DIAGNOSIS — E785 Hyperlipidemia, unspecified: Secondary | ICD-10-CM | POA: Diagnosis present

## 2019-01-05 DIAGNOSIS — D638 Anemia in other chronic diseases classified elsewhere: Secondary | ICD-10-CM | POA: Diagnosis present

## 2019-01-05 DIAGNOSIS — Z950 Presence of cardiac pacemaker: Secondary | ICD-10-CM

## 2019-01-05 DIAGNOSIS — I714 Abdominal aortic aneurysm, without rupture: Secondary | ICD-10-CM | POA: Diagnosis not present

## 2019-01-05 DIAGNOSIS — S7291XA Unspecified fracture of right femur, initial encounter for closed fracture: Secondary | ICD-10-CM | POA: Diagnosis not present

## 2019-01-05 DIAGNOSIS — I6521 Occlusion and stenosis of right carotid artery: Secondary | ICD-10-CM | POA: Diagnosis not present

## 2019-01-05 DIAGNOSIS — R52 Pain, unspecified: Secondary | ICD-10-CM | POA: Diagnosis not present

## 2019-01-05 DIAGNOSIS — S72141A Displaced intertrochanteric fracture of right femur, initial encounter for closed fracture: Secondary | ICD-10-CM | POA: Diagnosis not present

## 2019-01-05 DIAGNOSIS — I639 Cerebral infarction, unspecified: Secondary | ICD-10-CM | POA: Diagnosis not present

## 2019-01-05 DIAGNOSIS — I499 Cardiac arrhythmia, unspecified: Secondary | ICD-10-CM | POA: Diagnosis not present

## 2019-01-05 DIAGNOSIS — M25572 Pain in left ankle and joints of left foot: Secondary | ICD-10-CM | POA: Diagnosis not present

## 2019-01-05 HISTORY — DX: Unspecified atrial fibrillation: I48.91

## 2019-01-05 LAB — COMPREHENSIVE METABOLIC PANEL
ALT: 26 U/L (ref 0–44)
AST: 35 U/L (ref 15–41)
Albumin: 4.3 g/dL (ref 3.5–5.0)
Alkaline Phosphatase: 46 U/L (ref 38–126)
Anion gap: 9 (ref 5–15)
BUN: 21 mg/dL (ref 8–23)
CO2: 28 mmol/L (ref 22–32)
Calcium: 9.4 mg/dL (ref 8.9–10.3)
Chloride: 106 mmol/L (ref 98–111)
Creatinine, Ser: 0.96 mg/dL (ref 0.44–1.00)
GFR calc Af Amer: >60 mL/min (ref >60)
GFR calc non Af Amer: 54 mL/min — ABNORMAL LOW (ref >60)
Glucose, Bld: 142 mg/dL — ABNORMAL HIGH (ref 70–99)
Potassium: 3.8 mmol/L (ref 3.5–5.1)
Sodium: 143 mmol/L (ref 135–145)
Total Bilirubin: 0.7 mg/dL (ref 0.3–1.2)
Total Protein: 6.7 g/dL (ref 6.5–8.1)

## 2019-01-05 LAB — URINALYSIS, COMPLETE (UACMP) WITH MICROSCOPIC
Bilirubin Urine: NEGATIVE
Glucose, UA: NEGATIVE mg/dL
Hgb urine dipstick: NEGATIVE
Ketones, ur: 20 mg/dL — AB
Nitrite: POSITIVE — AB
Protein, ur: 30 mg/dL — AB
Specific Gravity, Urine: 1.02 (ref 1.005–1.030)
Squamous Epithelial / HPF: NONE SEEN (ref 0–5)
WBC, UA: >50 WBC/hpf — ABNORMAL HIGH (ref 0–5)
pH: 5 (ref 5.0–8.0)

## 2019-01-05 LAB — CBC WITH DIFFERENTIAL/PLATELET
Abs Immature Granulocytes: 0.08 10*3/uL — ABNORMAL HIGH (ref 0.00–0.07)
Basophils Absolute: 0.1 10*3/uL (ref 0.0–0.1)
Basophils Relative: 1 %
Eosinophils Absolute: 0.1 10*3/uL (ref 0.0–0.5)
Eosinophils Relative: 1 %
HCT: 41.1 % (ref 36.0–46.0)
Hemoglobin: 13.3 g/dL (ref 12.0–15.0)
Immature Granulocytes: 1 %
Lymphocytes Relative: 7 %
Lymphs Abs: 0.9 10*3/uL (ref 0.7–4.0)
MCH: 32.3 pg (ref 26.0–34.0)
MCHC: 32.4 g/dL (ref 30.0–36.0)
MCV: 99.8 fL (ref 80.0–100.0)
Monocytes Absolute: 0.7 10*3/uL (ref 0.1–1.0)
Monocytes Relative: 6 %
Neutro Abs: 10.1 10*3/uL — ABNORMAL HIGH (ref 1.7–7.7)
Neutrophils Relative %: 84 %
Platelets: 556 10*3/uL — ABNORMAL HIGH (ref 150–400)
RBC: 4.12 MIL/uL (ref 3.87–5.11)
RDW: 13.9 % (ref 11.5–15.5)
WBC: 11.9 10*3/uL — ABNORMAL HIGH (ref 4.0–10.5)
nRBC: 0 % (ref 0.0–0.2)

## 2019-01-05 LAB — SARS CORONAVIRUS 2 BY RT PCR (HOSPITAL ORDER, PERFORMED IN ~~LOC~~ HOSPITAL LAB): SARS Coronavirus 2: NEGATIVE

## 2019-01-05 MED ORDER — DOCUSATE SODIUM 100 MG PO CAPS
100.0000 mg | ORAL_CAPSULE | Freq: Two times a day (BID) | ORAL | Status: DC
  Administered 2019-01-05: 100 mg via ORAL
  Filled 2019-01-05: qty 1

## 2019-01-05 MED ORDER — HYDROMORPHONE HCL 1 MG/ML IJ SOLN
0.5000 mg | Freq: Once | INTRAMUSCULAR | Status: AC
  Administered 2019-01-05: 12:00:00 0.5 mg via INTRAVENOUS
  Filled 2019-01-05: qty 1

## 2019-01-05 MED ORDER — ONDANSETRON HCL 4 MG/2ML IJ SOLN
4.0000 mg | Freq: Four times a day (QID) | INTRAMUSCULAR | Status: DC | PRN
  Administered 2019-01-06: 4 mg via INTRAVENOUS

## 2019-01-05 MED ORDER — SODIUM CHLORIDE 0.9 % IV SOLN
1.0000 g | INTRAVENOUS | Status: DC
  Administered 2019-01-05: 1 g via INTRAVENOUS
  Filled 2019-01-05: qty 10
  Filled 2019-01-05: qty 1
  Filled 2019-01-05: qty 10

## 2019-01-05 MED ORDER — PHENYTOIN SODIUM EXTENDED 100 MG PO CAPS
100.0000 mg | ORAL_CAPSULE | ORAL | Status: DC
  Administered 2019-01-06: 100 mg via ORAL
  Filled 2019-01-05: qty 1

## 2019-01-05 MED ORDER — POLYETHYLENE GLYCOL 3350 17 G PO PACK: 17.0000 g | PACK | Freq: Every day | ORAL | Status: DC | PRN

## 2019-01-05 MED ORDER — PHENYTOIN SODIUM EXTENDED 100 MG PO CAPS
300.0000 mg | ORAL_CAPSULE | Freq: Every day | ORAL | Status: DC
  Administered 2019-01-06: 300 mg via ORAL
  Filled 2019-01-05 (×2): qty 3

## 2019-01-05 MED ORDER — FLECAINIDE ACETATE 50 MG PO TABS
50.0000 mg | ORAL_TABLET | Freq: Two times a day (BID) | ORAL | Status: DC
  Administered 2019-01-05 – 2019-01-06 (×3): 50 mg via ORAL
  Filled 2019-01-05 (×6): qty 1

## 2019-01-05 MED ORDER — ONDANSETRON HCL 4 MG PO TABS: 4.0000 mg | ORAL_TABLET | Freq: Four times a day (QID) | ORAL | Status: DC | PRN

## 2019-01-05 MED ORDER — METOPROLOL TARTRATE 25 MG PO TABS
25.0000 mg | ORAL_TABLET | Freq: Two times a day (BID) | ORAL | Status: DC
  Administered 2019-01-05 – 2019-01-06 (×2): 25 mg via ORAL
  Filled 2019-01-05 (×2): qty 1

## 2019-01-05 MED ORDER — PHENOBARBITAL 64.8 MG PO TABS
64.8000 mg | ORAL_TABLET | Freq: Every day | ORAL | Status: DC
  Administered 2019-01-05: 64.8 mg via ORAL
  Filled 2019-01-05: qty 2

## 2019-01-05 MED ORDER — CEFAZOLIN SODIUM-DEXTROSE 1-4 GM/50ML-% IV SOLN
1.0000 g | Freq: Three times a day (TID) | INTRAVENOUS | Status: DC
  Administered 2019-01-06: 1 g via INTRAVENOUS
  Filled 2019-01-05 (×4): qty 50

## 2019-01-05 MED ORDER — DILTIAZEM HCL 60 MG PO TABS
60.0000 mg | ORAL_TABLET | Freq: Three times a day (TID) | ORAL | Status: DC
  Administered 2019-01-05 – 2019-01-06 (×4): 60 mg via ORAL
  Filled 2019-01-05 (×4): qty 2

## 2019-01-05 MED ORDER — ACETAMINOPHEN 325 MG PO TABS: 650.0000 mg | ORAL_TABLET | Freq: Four times a day (QID) | ORAL | Status: DC | PRN

## 2019-01-05 MED ORDER — OXYCODONE HCL 5 MG PO TABS
5.0000 mg | ORAL_TABLET | ORAL | Status: DC | PRN
  Administered 2019-01-05 – 2019-01-06 (×3): 5 mg via ORAL
  Filled 2019-01-05 (×3): qty 1

## 2019-01-05 MED ORDER — ADULT MULTIVITAMIN W/MINERALS CH
1.0000 | ORAL_TABLET | Freq: Every day | ORAL | Status: DC
  Administered 2019-01-05: 19:00:00 1 via ORAL
  Filled 2019-01-05: qty 1

## 2019-01-05 MED ORDER — MORPHINE SULFATE (PF) 2 MG/ML IV SOLN
2.0000 mg | INTRAVENOUS | Status: DC | PRN
  Administered 2019-01-05 – 2019-01-06 (×2): 2 mg via INTRAVENOUS
  Filled 2019-01-05 (×2): qty 1

## 2019-01-05 MED ORDER — ACETAMINOPHEN 650 MG RE SUPP: 650.0000 mg | Freq: Four times a day (QID) | RECTAL | Status: DC | PRN

## 2019-01-05 MED ORDER — HYDROMORPHONE HCL 1 MG/ML IJ SOLN
0.5000 mg | Freq: Once | INTRAMUSCULAR | Status: AC
  Administered 2019-01-05: 0.5 mg via INTRAVENOUS
  Filled 2019-01-05: qty 1

## 2019-01-05 MED ORDER — HYDROMORPHONE HCL 1 MG/ML IJ SOLN: 0.5000 mg | Freq: Once | INTRAMUSCULAR | Status: DC

## 2019-01-05 NOTE — H&P (Addendum)
[facility] [place] NAME: Kelly Swanson    MR#:  [id number]  DATE OF BIRTH:  09-29-1931  DATE OF ADMISSION:  12/12/2018  PRIMARY CARE PHYSICIAN: Adin Hector, MD   REQUESTING/REFERRING PHYSICIAN: Dr. Lenise Arena  CHIEF COMPLAINT:   Chief Complaint  Patient presents with  . Fall    HISTORY OF PRESENT ILLNESS:  Kelly Swanson  is a 85 y.o. female with a known history of atrial fibrillation status post prior ablation and cardioversion, on Eliquis, status post pacemaker, chronic heart failure, mitral valve disease status post annuloplasty, seizure disorder, anemia of chronic disease and AAA of 3.4 cm presents to hospital secondary to mechanical fall at home and right hip fracture. Patient is active at baseline, independent.  She woke up early this morning and went to work on her flower bed, she slipped on the dirt and fell.  Complains of significant right hip pain.  X-rays in the emergency room showing comminuted intertrochanteric right femoral fracture with varus angulation.  Patient takes Eliquis for her A. fib.  Last dose was this morning.  Ortho has been consulted and they are planning for surgery possibly tomorrow. Patient denies any chest pain, recent palpitations or dyspnea.  She has chronic pedal edema.  No fevers or chills.  No nausea or vomiting.  No recent illnesses.  No sick contacts or recent travel.  PAST MEDICAL HISTORY:   Past Medical History:  Diagnosis Date  . AAA (abdominal aortic aneurysm) (H[facility])    3.4cm  . Anemia   . Ankle fracture   . Atrial fibrillation ([place])    on eliquis  . CHF (congestive heart failure) ([place])   . Colon polyps   . DJD (degenerative joint disease)   . Hyperlipidemia   . Leukopenia   . Mitral valve regurgitation    s/p mitral annuloplasty  . Osteoporosis   . Recurrent UTI   . Right eye trauma   . Seizures ([place])     PAST SURGICAL HISTORY:   Past Surgical History:  Procedure  Laterality Date  . ABDOMINAL HYSTERECTOMY    . CLOSED REDUCTION HUMERAL SHAFT FRACTURE Left   . CLOSED REDUCTION RADIAL SHAFT FRACTURE Right   . ELECTROPHYSIOLOGIC STUDY N/A 03/31/2016   Procedure: CARDIOVERSION;  Surgeon: Isaias Cowman, MD;  Location: [facility] ORS;  Service: Cardiovascular;  Laterality: N/A;  . EYE SURGERY    . FOOT SURGERY Left   . hip nailing Left   . MITRAL VALVULOPLASTY    . ORIF WRIST FRACTURE Left     SOCIAL HISTORY:   Social History   Tobacco Use  . Smoking status: Former Smoker    Packs/day: 0.25    Years: 30.00    Pack years: 7.50    Types: Cigarettes  . Smokeless tobacco: Never Used  Substance Use Topics  . Alcohol use: Yes    Comment: rare    FAMILY HISTORY:   Family History  Problem Relation Age of Onset  . Breast cancer Sister   . Liver cancer Brother     DRUG ALLERGIES:  No Known Allergies  REVIEW OF SYSTEMS:   Review of Systems  Constitutional: Negative for chills, fever, malaise/fatigue and weight loss.  HENT: Negative for ear discharge, ear pain, hearing loss, nosebleeds and tinnitus.   Eyes: Positive for blurred vision. Negative for double vision and photophobia.  Respiratory: Negative for cough, hemoptysis, shortness of breath and wheezing.   Cardiovascular: Negative for chest pain, palpitations,  orthopnea and leg swelling.  Gastrointestinal: Negative for abdominal pain, constipation, diarrhea, heartburn, melena, nausea and vomiting.  Genitourinary: Negative for dysuria, frequency, hematuria and urgency.  Musculoskeletal: Positive for falls, joint pain and myalgias. Negative for back pain and neck pain.  Skin: Negative for rash.  Neurological: Negative for dizziness, tingling, tremors, sensory change, speech change, focal weakness and headaches.  Endo/Heme/Allergies: Does not bruise/bleed easily.  Psychiatric/Behavioral: Negative for depression.    MEDICATIONS AT HOME:   Prior to Admission medications   Medication Sig  Start Date End Date Taking? Authorizing Provider  apixaban (ELIQUIS) 5 MG TABS tablet Take 5 mg by mouth 2 (two) times daily.   Yes [provider]  beta carotene w/minerals (OCUVITE) tablet Take 1 tablet by mouth daily.   Yes [provider]  Calcium Carbonate-Vitamin D 600-400 MG-UNIT per tablet Take 1 tablet by mouth daily.    Yes [provider]  diltiazem (CARDIZEM) 60 MG tablet Take 60 mg by mouth 3 (three) times daily.   Yes [provider]  furosemide (LASIX) 20 MG tablet Take 20 mg by mouth daily.   Yes [provider]  metoprolol succinate (TOPROL-XL) 50 MG 24 hr tablet Take 50 mg by mouth 2 (two) times a day.   Yes [provider]  Multiple Vitamins-Minerals (CENTRUM SILVER PO) Take 1 tablet by mouth. Once daily in the morning.   Yes [provider]  PHENobarbital (LUMINAL) 64.8 MG tablet Take 64.8 mg by mouth at bedtime.   Yes [provider]  phenytoin (DILANTIN) 100 MG ER capsule TAKE AS DIRECTED (TOTAL OF 300MG  PER DAY) 02/04/18  Yes [provider]      VITAL SIGNS:  Blood pressure (!) 165/87, pulse 88, temperature 97.8 F (36.6 C), temperature source Oral, resp. rate 16, weight 65.3 kg.  PHYSICAL EXAMINATION:  Physical Exam  GENERAL:  85 y.o.-year-old patient lying in the bed with no acute distress.  EYES: Left pupil equal, round, reactive to light and accommodation.  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  No scleral icterus. Extraocular muscles intact.  HEENT: Head atraumatic, normocephalic. Oropharynx and nasopharynx clear.  NECK:  Supple, no jugular venous distention. No thyroid enlargement, no tenderness.  LUNGS: Normal breath sounds bilaterally, no wheezing, rales,rhonchi or crepitation. No use of accessory muscles of respiration.  Decreased bibasilar breath sounds CARDIOVASCULAR: S1, S2 normal. No  rubs, or gallops.  2/6 systolic murmur is present ABDOMEN: Soft, nontender, nondistended. Bowel  sounds present. No organomegaly or mass.  EXTREMITIES: No  cyanosis, or clubbing.  Chronic 2+ bilateral pedal edema noted.  Right leg is abducted and externally rotated. NEUROLOGIC: Cranial nerves II through XII are intact. Muscle strength 5/5 in all extremities except right leg due to pain. Sensation intact. Gait not checked.  PSYCHIATRIC: The patient is alert and oriented x 3.  SKIN: No obvious rash, lesion, or ulcer.   LABORATORY PANEL:   CBC Recent Labs  Lab 12/29/2018 1130  WBC 11.9*  HGB 13.3  HCT 41.1  PLT 556*   ------------------------------------------------------------------------------------------------------------------  Chemistries  Recent Labs  Lab 12/28/2018 1130  NA 143  K 3.8  CL 106  CO2 28  GLUCOSE 142*  BUN 21  CREATININE 0.96  CALCIUM 9.4  AST 35  ALT 26  ALKPHOS 46  BILITOT 0.7   ------------------------------------------------------------------------------------------------------------------  Cardiac Enzymes No results for input(s): TROPONINI in the last 168 hours. ------------------------------------------------------------------------------------------------------------------  RADIOLOGY:  Dg Chest 1 View  Result Date: 12/18/2018 CLINICAL DATA:  RIGHT hip  fracture. EXAM: CHEST  1 VIEW COMPARISON:  10/04/2014 FINDINGS: Cardiomegaly and mitral valve replacement again noted. LEFT pacemaker identified with 3 leads. There is no evidence of focal airspace disease, pulmonary edema, suspicious pulmonary nodule/mass, pleural effusion, or pneumothorax. No acute bony abnormalities are identified. IMPRESSION: Cardiomegaly without evidence of acute cardiopulmonary disease. Electronically Signed   By: Margarette Canada M.D.   On: 12/14/2018 12:28   Dg Hip Unilat W Or Wo Pelvis 2-3 Views Right  Result Date: 12/16/2018 CLINICAL DATA:  Acute RIGHT hip pain following fall. Initial encounter. EXAM: DG HIP (WITH OR WITHOUT PELVIS) 2-3V RIGHT COMPARISON:  None. FINDINGS: A  comminuted intertrochanteric RIGHT femur fracture is noted with varus angulation. No dislocation. Surgical hardware within the proximal LEFT femur noted. No suspicious focal bony lesions are identified. IMPRESSION: Comminuted intertrochanteric RIGHT femur fracture with varus angulation. Electronically Signed   By: Margarette Canada M.D.   On: 12/16/2018 12:23      IMPRESSION AND PLAN:   Azilee Pirro  is a 85 y.o. female with a known history of atrial fibrillation status post prior ablation and cardioversion, on Eliquis, status post pacemaker, chronic heart failure, mitral valve disease status post annuloplasty, seizure disorder, anemia of chronic disease and AAA of 3.4 cm presents to hospital secondary to mechanical fall at home and right hip fracture.  1.  Comminuted right intertrochanteric fracture-Ortho has been consulted. -Admit to the floor.  Pain control -Patient has cardiac history but were asymptomatic.  She is considered moderate risk for noncardiac surgery-can proceed, no further work-up necessary -Hold Eliquis.  Possible surgery tomorrow -PT and Eliquis can be restarted after surgery.  Monitor hemoglobin postop for anemia  2.  Acute cystitis-follow-up urine cultures and started on Rocephin  3.  Atrial fibrillation-rate controlled.  Continue Cardizem orally and metoprolol.  Hold anticoagulation at this time.  Patient is status post pacemaker and follows with [facility] cardiology  4.  Seizure disorder-no recent history of seizures.  Continue Dilantin and phenobarbital.  5.  DVT prophylaxis-will be started after surgery  Updated 1 son-in-law at bedside, tried to reach her daughter June over the phone but couldn't reach her, so talked to Ms.June's husband and requested to pass on the information   All the records are reviewed and case discussed with ED provider. Management plans discussed with the patient, family and they are in agreement.  CODE STATUS: Full Code  TOTAL TIME TAKING CARE OF  THIS PATIENT: 51  minutes.    Gladstone Lighter M.D on 01/02/2019 at 2:17 PM  Between 7am to 6pm - Pager - [phone]  After 6pm go to [email or web address] - password [email or web address]  [employer] [facility]  Office  [phone]  CC: Primary care physician; Adin Hector, MD   Note: This dictation was prepared with Dragon dictation along with smaller phrase technology. Any transcriptional errors that result from this process are unintentional.

## 2019-01-05 NOTE — ED Provider Notes (Addendum)
[facility] Emergency Department Provider Note       Time seen: ----------------------------------------- 11:24 AM on 12/14/2018 -----------------------------------------   I have reviewed the triage vital signs and the nursing notes.  HISTORY   Chief Complaint Fall    HPI Kelly Swanson is a 85 y.o. female with a history of anemia, atrial flutter, CHF, hyperlipidemia, UTI, seizures who presents to the ED for mechanical fall working outside this morning.  She had obvious deformity to her right leg.  She was given IV fentanyl by EMS.  She complains of severe pain to the right leg when she moves it.  She denies any other injuries at this time.  Past Medical History:  Diagnosis Date  . AAA (abdominal aortic aneurysm) ([place])   . Anemia   . Ankle fracture   . Atrial flutter ([place])   . CHF (congestive heart failure) ([place])   . Colon polyps   . DJD (degenerative joint disease)   . Hyperlipidemia   . Leukopenia   . Mitral valve regurgitation   . Osteoporosis   . Recurrent UTI   . Right eye trauma   . Seizures ([place])     There are no active problems to display for this patient.   Past Surgical History:  Procedure Laterality Date  . ABDOMINAL HYSTERECTOMY    . CLOSED REDUCTION HUMERAL SHAFT FRACTURE Left   . CLOSED REDUCTION RADIAL SHAFT FRACTURE Right   . ELECTROPHYSIOLOGIC STUDY N/A 03/31/2016   Procedure: CARDIOVERSION;  Surgeon: Isaias Cowman, MD;  Location: [facility] ORS;  Service: Cardiovascular;  Laterality: N/A;  . EYE SURGERY    . FOOT SURGERY Left   . hip nailing Left   . MITRAL VALVULOPLASTY    . ORIF WRIST FRACTURE Left     Allergies Patient has no known allergies.  Social History Social History   Tobacco Use  . Smoking status: Former Smoker    Packs/day: 0.25    Years: 30.00    Pack years: 7.50    Types: Cigarettes  . Smokeless tobacco: Never Used  Substance Use Topics  . Alcohol use: Yes    Comment: rare  . Drug  use: No   Review of Systems Constitutional: Negative for fever. Cardiovascular: Negative for chest pain. Respiratory: Negative for shortness of breath. Gastrointestinal: Negative for abdominal pain, vomiting and diarrhea. Musculoskeletal: Positive for right leg pain Skin: Negative for rash. Neurological: Negative for headaches, focal weakness or numbness.  All systems negative/normal/unremarkable except as stated in the HPI  ____________________________________________   PHYSICAL EXAM:  VITAL SIGNS: ED Triage Vitals [12/07/2018 1123]  Enc Vitals Group     BP      Pulse      Resp      Temp      Temp src      SpO2      Weight 143 lb 15.4 oz (65.3 kg)     Height      Head Circumference      Peak Flow      Pain Score 0     Pain Loc      Pain Edu?      Excl. in [place]?    Constitutional: Alert and oriented. Well appearing and in no distress. Eyes: Conjunctivae are normal. Normal extraocular movements. Cardiovascular: Normal rate, regular rhythm. No murmurs, rubs, or gallops. Respiratory: Normal respiratory effort without tachypnea nor retractions. Breath sounds are clear and equal bilaterally. No wheezes/rales/rhonchi. Gastrointestinal: Soft and nontender. Normal bowel sounds Musculoskeletal: Right  lower extremity is shortened and externally rotated, severe pain with range of motion of the right hip Neurologic:  Normal speech and language. No gross focal neurologic deficits are appreciated.  Skin:  Skin is warm, dry and intact. No rash noted. Psychiatric: Mood and affect are normal. Speech and behavior are normal.    EKG: Interpreted by me, ventricular paced rhythm with rate of 70 bpm, normal pacemaker function noted ____________________________________________  ED COURSE:  As part of my medical decision making, I reviewed the following data within the [place] History obtained from family if available, nursing notes, old chart and ekg, as well as notes  from prior ED visits. Patient presented for fall with likely resulting hip fracture, we will assess with labs and imaging as indicated at this time.   Procedures  Riely Oetken was evaluated in Emergency Department on 12/24/2018 for the symptoms described in the history of present illness. She was evaluated in the context of the global COVID-19 pandemic, which necessitated consideration that the patient might be at risk for infection with the SARS-CoV-2 virus that causes COVID-19. Institutional protocols and algorithms that pertain to the evaluation of patients at risk for COVID-19 are in a state of rapid change based on information released by regulatory bodies including the CDC and federal and state organizations. These policies and algorithms were followed during the patient's care in the ED.  ____________________________________________   LABS (pertinent positives/negatives)  Labs Reviewed  CBC WITH DIFFERENTIAL/PLATELET - Abnormal; Notable for the following components:      Result Value   WBC 11.9 (*)    Platelets 556 (*)    Neutro Abs 10.1 (*)    Abs Immature Granulocytes 0.08 (*)    All other components within normal limits  COMPREHENSIVE METABOLIC PANEL - Abnormal; Notable for the following components:   Glucose, Bld 142 (*)    GFR calc non Af Amer 54 (*)    All other components within normal limits  SARS CORONAVIRUS 2 (HOSPITAL ORDER, [facility] LAB)  URINALYSIS, COMPLETE (UACMP) WITH MICROSCOPIC  CBG MONITORING, ED    RADIOLOGY Images were viewed by me  Right hip x-rays, chest x-ray Indicated a right hip fracture ____________________________________________   DIFFERENTIAL DIAGNOSIS   Fall, hip fracture, pelvis fracture, acetabular fracture, dislocation  FINAL ASSESSMENT AND PLAN  Fall, right hip fracture   Plan: The patient had presented for a fall with resulting right hip fracture. Patient's labs are grossly unremarkable at this time.  Patient's imaging do indicate a right hip fracture as expected.  I will discuss with orthopedics and internal medicine for admission.   Laurence Aly, MD    Note: This note was generated in part or whole with voice recognition software. Voice recognition is usually quite accurate but there are transcription errors that can and very often do occur. I apologize for any typographical errors that were not detected and corrected.     Earleen Newport, MD 12/31/2018 1210    Earleen Newport, MD 12/22/2018 [phone]

## 2019-01-05 NOTE — Progress Notes (Signed)
   [place] at [facility] Day: 0 days Kelly Swanson is a 85 y.o. female presenting with Fall .   Advance care planning discussed with patient at bedside. -Patient is alert and oriented.  Her son-in-law was also at bedside and was able to participate in the conversation. Patient has history of atrial fibrillation on anticoagulation, mitral valve annuloplasty, she is status post pacemaker, seizure disorder.  Very independent at baseline. Currently admitted after mechanical fall and right hip fracture. All questions in regards to overall condition and expected prognosis answered. The decision was made to continue current code status  CODE STATUS: Full code Time spent: 18 minutes

## 2019-01-05 NOTE — ED Triage Notes (Signed)
[address] while working outside [employer].  75 mcg fentanyl given per EMS.  18g RAC by EMS.  Right leg externally rotated and shortened.

## 2019-01-05 NOTE — ED Notes (Signed)
Amaryllis Dyke - [phone]  Patient's mother in law

## 2019-01-05 NOTE — Progress Notes (Signed)
Full consult note and discussion with patient to follow  Called by ED staff. Imaging reviewed.  - Plan for surgery tomorrow, likely afternoon.  - NPO after midnight - Hold anticoagulation. Last dose of Eliquis was today AM and surgery will be >24 hours since last dose. Low risk of bleeding from surgery. - Admit to Hospitalist team.

## 2019-01-05 NOTE — ED Notes (Addendum)
ED TO INPATIENT HANDOFF REPORT  ED Nurse Name and Phone #: Opal Sidles [phone]  S Name/Age/Gender Kelly Swanson 85 y.o. female Room/Bed: ED01HA/ED01HA  Code Status   Code Status: Prior  Home/SNF/Other Home Patient oriented to: self, place, time and situation Is this baseline? Yes   Triage Complete: Triage complete  Chief Complaint fall  Triage Note [address] while working outside this [employer].  75 mcg fentanyl given per EMS.  18g RAC by EMS.  Right leg externally rotated and shortened.   Allergies No Known Allergies  Level of Care/Admitting Diagnosis ED Disposition    ED Disposition Condition [facility] Area: [place] [[id number]]  Level of Care: Med-Surg [16]  Covid Evaluation: N/A  Diagnosis: Hip fracture [facility]) [[id number]]  Admitting Physician: Gladstone Lighter [[id number]]  Attending Physician: Gladstone Lighter [[id number]]  Estimated length of stay: past midnight tomorrow  Certification:: I certify this patient will need inpatient services for at least 2 midnights  PT Class (Do Not Modify): Inpatient [101]  PT Acc Code (Do Not Modify): Private [1]       B Medical/Surgery History Past Medical History:  Diagnosis Date  . AAA (abdominal aortic aneurysm) ([facility])    3.4cm  . Anemia   . Ankle fracture   . Atrial fibrillation ([place])    on eliquis  . CHF (congestive heart failure) ([place])   . Colon polyps   . DJD (degenerative joint disease)   . Hyperlipidemia   . Leukopenia   . Mitral valve regurgitation    s/p mitral annuloplasty  . Osteoporosis   . Recurrent UTI   . Right eye trauma   . Seizures ([place])    Past Surgical History:  Procedure Laterality Date  . ABDOMINAL HYSTERECTOMY    . CLOSED REDUCTION HUMERAL SHAFT FRACTURE Left   . CLOSED REDUCTION RADIAL SHAFT FRACTURE Right   . ELECTROPHYSIOLOGIC STUDY N/A 03/31/2016   Procedure: CARDIOVERSION;  Surgeon: Isaias Cowman, MD;  Location: [facility] ORS;  Service: Cardiovascular;   Laterality: N/A;  . EYE SURGERY    . FOOT SURGERY Left   . hip nailing Left   . MITRAL VALVULOPLASTY    . ORIF WRIST FRACTURE Left      A IV Location/Drains/Wounds Patient Lines/Drains/Airways Status   Active Line/Drains/Airways    Name:   Placement date:   Placement time:   Site:   Days:   Peripheral IV 09/28/17 Left Antecubital   09/28/17    1020    Antecubital   464   Peripheral IV 12/20/2018 Right Antecubital   12/26/2018    1132    Antecubital   less than 1          Intake/Output Last 24 hours No intake or output data in the 24 hours ending 12/10/2018 1232  Labs/Imaging Results for orders placed or performed during the hospital encounter of 12/17/2018 (from the past 48 hour(s))  CBC with Differential     Status: Abnormal   Collection Time: 12/13/2018 11:30 AM  Result Value Ref Range   WBC 11.9 (H) 4.0 - 10.5 K/uL   RBC 4.12 3.87 - 5.11 MIL/uL   Hemoglobin 13.3 12.0 - 15.0 g/dL   HCT 41.1 36.0 - 46.0 %   MCV 99.8 80.0 - 100.0 fL   MCH 32.3 26.0 - 34.0 pg   MCHC 32.4 30.0 - 36.0 g/dL   RDW 13.9 11.5 - 15.5 %   Platelets 556 (H) 150 - 400 K/uL   nRBC 0.0 0.0 -  0.2 %   Neutrophils Relative % 84 %   Neutro Abs 10.1 (H) 1.7 - 7.7 K/uL   Lymphocytes Relative 7 %   Lymphs Abs 0.9 0.7 - 4.0 K/uL   Monocytes Relative 6 %   Monocytes Absolute 0.7 0.1 - 1.0 K/uL   Eosinophils Relative 1 %   Eosinophils Absolute 0.1 0.0 - 0.5 K/uL   Basophils Relative 1 %   Basophils Absolute 0.1 0.0 - 0.1 K/uL   Immature Granulocytes 1 %   Abs Immature Granulocytes 0.08 (H) 0.00 - 0.07 K/uL    Comment: Performed at [facility], [address] [place], [place] [address]  Comprehensive metabolic panel     Status: Abnormal   Collection Time: 01/01/2019 11:30 AM  Result Value Ref Range   Sodium 143 135 - 145 mmol/L   Potassium 3.8 3.5 - 5.1 mmol/L   Chloride 106 98 - 111 mmol/L   CO2 28 22 - 32 mmol/L   Glucose, Bld 142 (H) 70 - 99 mg/dL   BUN 21 8 - 23 mg/dL   Creatinine, Ser 0.96  0.44 - 1.00 mg/dL   Calcium 9.4 8.9 - 10.3 mg/dL   Total Protein 6.7 6.5 - 8.1 g/dL   Albumin 4.3 3.5 - 5.0 g/dL   AST 35 15 - 41 U/L   ALT 26 0 - 44 U/L   Alkaline Phosphatase 46 38 - 126 U/L   Total Bilirubin 0.7 0.3 - 1.2 mg/dL   GFR calc non Af Amer 54 (L) >60 mL/min   GFR calc Af Amer >60 >60 mL/min   Anion gap 9 5 - 15    Comment: Performed at [facility], [address], [place], [place] [address]   Dg Chest 1 View  Result Date: 12/14/2018 CLINICAL DATA:  RIGHT hip fracture. EXAM: CHEST  1 VIEW COMPARISON:  10/04/2014 FINDINGS: Cardiomegaly and mitral valve replacement again noted. LEFT pacemaker identified with 3 leads. There is no evidence of focal airspace disease, pulmonary edema, suspicious pulmonary nodule/mass, pleural effusion, or pneumothorax. No acute bony abnormalities are identified. IMPRESSION: Cardiomegaly without evidence of acute cardiopulmonary disease. Electronically Signed   By: Margarette Canada M.D.   On: 12/17/2018 12:28   Dg Hip Unilat W Or Wo Pelvis 2-3 Views Right  Result Date: 12/30/2018 CLINICAL DATA:  Acute RIGHT hip pain following fall. Initial encounter. EXAM: DG HIP (WITH OR WITHOUT PELVIS) 2-3V RIGHT COMPARISON:  None. FINDINGS: A comminuted intertrochanteric RIGHT femur fracture is noted with varus angulation. No dislocation. Surgical hardware within the proximal LEFT femur noted. No suspicious focal bony lesions are identified. IMPRESSION: Comminuted intertrochanteric RIGHT femur fracture with varus angulation. Electronically Signed   By: Margarette Canada M.D.   On: 12/19/2018 12:23    Pending Labs Unresulted Labs (From admission, onward)    Start     Ordered   12/21/2018 1127  Urinalysis, Complete w Microscopic  (ALOC)  ONCE - STAT,   STAT     12/08/2018 1127   12/08/2018 1127  SARS Coronavirus 2 (CEPHEID - Performed in [place] hospital lab), Hosp Order  (Asymptomatic Patients Labs)  Once,   STAT    Question:  Rule Out  Answer:  Yes   12/31/2018 1127    Signed and Held  Basic metabolic panel  Tomorrow morning,   R     Signed and Held   Signed and Held  CBC  Tomorrow morning,   R     Signed and Held  Vitals/Pain Today's Vitals   12/23/2018 1123 12/24/2018 1126  BP:  (!) 165/87  Pulse:  88  Resp:  16  Temp:  97.8 F (36.6 C)  TempSrc:  Oral  Weight: 65.3 kg   PainSc: 0-No pain     Isolation Precautions No active isolations  Medications Medications  HYDROmorphone (DILAUDID) injection 0.5 mg (0.5 mg Intravenous Given 01/04/2019 1213)    Mobility walks Low fall risk   Focused Assessments .   R Recommendations: See Admitting Provider Note  Report given to:   Additional Notes:   Patient has a right prosthetic eye

## 2019-01-05 NOTE — ED Notes (Signed)
Awaiting C-19 results before able to move patient to inpatient bed.  Family notified

## 2019-01-06 ENCOUNTER — Encounter: Payer: Self-pay | Admitting: *Deleted

## 2019-01-06 ENCOUNTER — Inpatient Hospital Stay: Payer: Medicare HMO

## 2019-01-06 ENCOUNTER — Encounter: Admission: EM | Disposition: E | Payer: Self-pay | Source: Home / Self Care | Attending: Internal Medicine

## 2019-01-06 ENCOUNTER — Inpatient Hospital Stay: Payer: Medicare HMO | Admitting: Anesthesiology

## 2019-01-06 DIAGNOSIS — I63231 Cerebral infarction due to unspecified occlusion or stenosis of right carotid arteries: Secondary | ICD-10-CM

## 2019-01-06 HISTORY — PX: INTRAMEDULLARY (IM) NAIL INTERTROCHANTERIC: SHX5875

## 2019-01-06 LAB — CBC
HCT: 37.2 % (ref 36.0–46.0)
Hemoglobin: 11.9 g/dL — ABNORMAL LOW (ref 12.0–15.0)
MCH: 31.8 pg (ref 26.0–34.0)
MCHC: 32 g/dL (ref 30.0–36.0)
MCV: 99.5 fL (ref 80.0–100.0)
Platelets: 452 10*3/uL — ABNORMAL HIGH (ref 150–400)
RBC: 3.74 MIL/uL — ABNORMAL LOW (ref 3.87–5.11)
RDW: 14.1 % (ref 11.5–15.5)
WBC: 8.2 10*3/uL (ref 4.0–10.5)
nRBC: 0 % (ref 0.0–0.2)

## 2019-01-06 LAB — BASIC METABOLIC PANEL
Anion gap: 9 (ref 5–15)
BUN: 20 mg/dL (ref 8–23)
CO2: 26 mmol/L (ref 22–32)
Calcium: 9.1 mg/dL (ref 8.9–10.3)
Chloride: 107 mmol/L (ref 98–111)
Creatinine, Ser: 0.87 mg/dL (ref 0.44–1.00)
GFR calc Af Amer: >60 mL/min (ref >60)
GFR calc non Af Amer: >60 mL/min (ref >60)
Glucose, Bld: 115 mg/dL — ABNORMAL HIGH (ref 70–99)
Potassium: 3.8 mmol/L (ref 3.5–5.1)
Sodium: 142 mmol/L (ref 135–145)

## 2019-01-06 LAB — MRSA PCR SCREENING: MRSA by PCR: NEGATIVE

## 2019-01-06 SURGERY — FIXATION, FRACTURE, INTERTROCHANTERIC, WITH INTRAMEDULLARY ROD
Anesthesia: General | Site: Hip | Laterality: Right

## 2019-01-06 MED ORDER — MIDAZOLAM HCL 2 MG/2ML IJ SOLN
INTRAMUSCULAR | Status: AC
  Filled 2019-01-06: qty 2

## 2019-01-06 MED ORDER — FENTANYL CITRATE (PF) 100 MCG/2ML IJ SOLN
INTRAMUSCULAR | Status: DC | PRN
  Administered 2019-01-06 (×2): 50 ug via INTRAVENOUS

## 2019-01-06 MED ORDER — SODIUM CHLORIDE 0.9 % IV SOLN
INTRAVENOUS | Status: DC
  Administered 2019-01-06: 17:00:00 via INTRAVENOUS

## 2019-01-06 MED ORDER — SODIUM CHLORIDE 0.9 % IV SOLN
INTRAVENOUS | Status: DC | PRN
  Administered 2019-01-06: 15 ug/min via INTRAVENOUS

## 2019-01-06 MED ORDER — PHENYLEPHRINE HCL (PRESSORS) 10 MG/ML IV SOLN
INTRAVENOUS | Status: DC | PRN
  Administered 2019-01-06 (×2): 100 ug via INTRAVENOUS

## 2019-01-06 MED ORDER — METOCLOPRAMIDE HCL 10 MG PO TABS
5.0000 mg | ORAL_TABLET | Freq: Three times a day (TID) | ORAL | Status: DC | PRN
  Filled 2019-01-06: qty 1

## 2019-01-06 MED ORDER — GLYCOPYRROLATE 0.2 MG/ML IJ SOLN
INTRAMUSCULAR | Status: DC | PRN
  Administered 2019-01-06: 0.2 mg via INTRAVENOUS

## 2019-01-06 MED ORDER — LACTATED RINGERS IV SOLN
INTRAVENOUS | Status: DC | PRN
  Administered 2019-01-06: 12:00:00 via INTRAVENOUS

## 2019-01-06 MED ORDER — DOCUSATE SODIUM 100 MG PO CAPS: 100.0000 mg | ORAL_CAPSULE | Freq: Two times a day (BID) | ORAL | Status: DC

## 2019-01-06 MED ORDER — BISACODYL 10 MG RE SUPP: 10.0000 mg | Freq: Every day | RECTAL | Status: DC | PRN

## 2019-01-06 MED ORDER — SODIUM CHLORIDE 0.9 % IR SOLN
Status: DC | PRN
  Administered 2019-01-06: 1000 mL

## 2019-01-06 MED ORDER — GLYCOPYRROLATE 0.2 MG/ML IJ SOLN
INTRAMUSCULAR | Status: AC
  Filled 2019-01-06: qty 1

## 2019-01-06 MED ORDER — METHOCARBAMOL 500 MG PO TABS
500.0000 mg | ORAL_TABLET | Freq: Four times a day (QID) | ORAL | Status: DC | PRN
  Administered 2019-01-06: 500 mg via ORAL
  Filled 2019-01-06 (×2): qty 1

## 2019-01-06 MED ORDER — FLEET ENEMA 7-19 GM/118ML RE ENEM: 1.0000 | ENEMA | Freq: Once | RECTAL | Status: DC | PRN

## 2019-01-06 MED ORDER — EPHEDRINE SULFATE 50 MG/ML IJ SOLN
INTRAMUSCULAR | Status: DC | PRN
  Administered 2019-01-06: 10 mg via INTRAVENOUS
  Administered 2019-01-06: 5 mg via INTRAVENOUS
  Administered 2019-01-06: 10 mg via INTRAVENOUS

## 2019-01-06 MED ORDER — SUCCINYLCHOLINE CHLORIDE 20 MG/ML IJ SOLN
INTRAMUSCULAR | Status: DC | PRN
  Administered 2019-01-06: 100 mg via INTRAVENOUS

## 2019-01-06 MED ORDER — SENNOSIDES-DOCUSATE SODIUM 8.6-50 MG PO TABS: 1.0000 | ORAL_TABLET | Freq: Every evening | ORAL | Status: DC | PRN

## 2019-01-06 MED ORDER — OXYCODONE HCL 5 MG PO TABS
2.5000 mg | ORAL_TABLET | ORAL | Status: DC | PRN
  Administered 2019-01-06: 5 mg via ORAL
  Filled 2019-01-06: qty 1

## 2019-01-06 MED ORDER — LIDOCAINE HCL URETHRAL/MUCOSAL 2 % EX GEL
CUTANEOUS | Status: AC
  Filled 2019-01-06: qty 5

## 2019-01-06 MED ORDER — DEXAMETHASONE SODIUM PHOSPHATE 10 MG/ML IJ SOLN
INTRAMUSCULAR | Status: AC
  Filled 2019-01-06: qty 1

## 2019-01-06 MED ORDER — ONDANSETRON HCL 4 MG/2ML IJ SOLN
INTRAMUSCULAR | Status: AC
  Filled 2019-01-06: qty 2

## 2019-01-06 MED ORDER — SUCCINYLCHOLINE CHLORIDE 20 MG/ML IJ SOLN
INTRAMUSCULAR | Status: AC
  Filled 2019-01-06: qty 1

## 2019-01-06 MED ORDER — APIXABAN 5 MG PO TABS: 5.0000 mg | ORAL_TABLET | Freq: Two times a day (BID) | ORAL | Status: DC

## 2019-01-06 MED ORDER — METHOCARBAMOL 1000 MG/10ML IJ SOLN
500.0000 mg | Freq: Four times a day (QID) | INTRAVENOUS | Status: DC | PRN
  Filled 2019-01-06: qty 5

## 2019-01-06 MED ORDER — OXYCODONE HCL 5 MG/5ML PO SOLN: 5.0000 mg | Freq: Once | ORAL | Status: DC | PRN

## 2019-01-06 MED ORDER — OXYCODONE HCL 5 MG PO TABS: 5.0000 mg | ORAL_TABLET | Freq: Once | ORAL | Status: DC | PRN

## 2019-01-06 MED ORDER — PROPOFOL 10 MG/ML IV BOLUS
INTRAVENOUS | Status: AC
  Filled 2019-01-06: qty 20

## 2019-01-06 MED ORDER — ACETAMINOPHEN 325 MG PO TABS: 325.0000 mg | ORAL_TABLET | Freq: Four times a day (QID) | ORAL | Status: DC | PRN

## 2019-01-06 MED ORDER — HYDROMORPHONE HCL 1 MG/ML IJ SOLN
0.2500 mg | INTRAMUSCULAR | Status: DC | PRN
  Administered 2019-01-07: 0.5 mg via INTRAVENOUS
  Filled 2019-01-06: qty 1

## 2019-01-06 MED ORDER — IOHEXOL 350 MG/ML SOLN
40.0000 mL | Freq: Once | INTRAVENOUS | Status: AC | PRN
  Administered 2019-01-06: 22:00:00 40 mL via INTRAVENOUS

## 2019-01-06 MED ORDER — CEFAZOLIN SODIUM-DEXTROSE 1-4 GM/50ML-% IV SOLN
1.0000 g | Freq: Four times a day (QID) | INTRAVENOUS | Status: DC
  Filled 2019-01-06 (×3): qty 50

## 2019-01-06 MED ORDER — FENTANYL CITRATE (PF) 100 MCG/2ML IJ SOLN
INTRAMUSCULAR | Status: AC
  Filled 2019-01-06: qty 2

## 2019-01-06 MED ORDER — FENTANYL CITRATE (PF) 100 MCG/2ML IJ SOLN
25.0000 ug | INTRAMUSCULAR | Status: DC | PRN
  Administered 2019-01-06: 25 ug via INTRAVENOUS

## 2019-01-06 MED ORDER — ROCURONIUM BROMIDE 50 MG/5ML IV SOLN
INTRAVENOUS | Status: AC
  Filled 2019-01-06: qty 1

## 2019-01-06 MED ORDER — DEXAMETHASONE SODIUM PHOSPHATE 10 MG/ML IJ SOLN
INTRAMUSCULAR | Status: DC | PRN
  Administered 2019-01-06: 10 mg via INTRAVENOUS

## 2019-01-06 MED ORDER — BUPIVACAINE LIPOSOME 1.3 % IJ SUSP
INTRAMUSCULAR | Status: DC | PRN
  Administered 2019-01-06: 50 mL

## 2019-01-06 MED ORDER — SUGAMMADEX SODIUM 200 MG/2ML IV SOLN
INTRAVENOUS | Status: DC | PRN
  Administered 2019-01-06: 200 mg via INTRAVENOUS

## 2019-01-06 MED ORDER — OXYCODONE HCL 5 MG PO TABS: 5.0000 mg | ORAL_TABLET | ORAL | Status: DC | PRN

## 2019-01-06 MED ORDER — ACETAMINOPHEN 500 MG PO TABS
1000.0000 mg | ORAL_TABLET | Freq: Three times a day (TID) | ORAL | Status: DC
  Administered 2019-01-06: 17:00:00 1000 mg via ORAL
  Filled 2019-01-06: qty 2

## 2019-01-06 MED ORDER — PROPOFOL 10 MG/ML IV BOLUS
INTRAVENOUS | Status: DC | PRN
  Administered 2019-01-06: 100 mg via INTRAVENOUS

## 2019-01-06 MED ORDER — IOHEXOL 350 MG/ML SOLN
75.0000 mL | Freq: Once | INTRAVENOUS | Status: AC | PRN
  Administered 2019-01-06: 21:00:00 75 mL via INTRAVENOUS

## 2019-01-06 MED ORDER — METOCLOPRAMIDE HCL 5 MG/ML IJ SOLN: 5.0000 mg | Freq: Three times a day (TID) | INTRAMUSCULAR | Status: DC | PRN

## 2019-01-06 MED ORDER — LIDOCAINE HCL (CARDIAC) PF 100 MG/5ML IV SOSY
PREFILLED_SYRINGE | INTRAVENOUS | Status: DC | PRN
  Administered 2019-01-06: 80 mg via INTRAVENOUS

## 2019-01-06 MED ORDER — TRAMADOL HCL 50 MG PO TABS: 50.0000 mg | ORAL_TABLET | Freq: Four times a day (QID) | ORAL | Status: DC | PRN

## 2019-01-06 MED ORDER — ONDANSETRON HCL 4 MG PO TABS: 4.0000 mg | ORAL_TABLET | Freq: Four times a day (QID) | ORAL | Status: DC | PRN

## 2019-01-06 MED ORDER — SUGAMMADEX SODIUM 200 MG/2ML IV SOLN
INTRAVENOUS | Status: AC
  Filled 2019-01-06: qty 2

## 2019-01-06 MED ORDER — ONDANSETRON HCL 4 MG/2ML IJ SOLN: 4.0000 mg | Freq: Four times a day (QID) | INTRAMUSCULAR | Status: DC | PRN

## 2019-01-06 MED ORDER — ROCURONIUM BROMIDE 100 MG/10ML IV SOLN
INTRAVENOUS | Status: DC | PRN
  Administered 2019-01-06: 20 mg via INTRAVENOUS
  Administered 2019-01-06: 10 mg via INTRAVENOUS

## 2019-01-06 SURGICAL SUPPLY — 47 items
"PENCIL ELECTRO HAND CTR " (MISCELLANEOUS) ×1 IMPLANT
BIT DRILL AO GAMMA 4.2X340 (BIT) ×2 IMPLANT
BLADE SURG 15 STRL LF DISP TIS (BLADE) ×1 IMPLANT
BLADE SURG 15 STRL SS (BLADE) ×2
CANISTER SUCT 1200ML W/VALVE (MISCELLANEOUS) ×3 IMPLANT
CHLORAPREP W/TINT 26 (MISCELLANEOUS) ×3 IMPLANT
COVER WAND RF STERILE (DRAPES) ×3 IMPLANT
DRAPE 3/4 80X56 (DRAPES) ×3 IMPLANT
DRAPE SURG 17X11 SM STRL (DRAPES) ×6 IMPLANT
DRAPE U-SHAPE 47X51 STRL (DRAPES) ×6 IMPLANT
DRSG OPSITE POSTOP 3X4 (GAUZE/BANDAGES/DRESSINGS) ×9 IMPLANT
DRSG OPSITE POSTOP 4X6 (GAUZE/BANDAGES/DRESSINGS) ×2 IMPLANT
ELECT REM PT RETURN 9FT ADLT (ELECTROSURGICAL) ×3
ELECTRODE REM PT RTRN 9FT ADLT (ELECTROSURGICAL) ×1 IMPLANT
GLOVE BIOGEL PI IND STRL 8 (GLOVE) ×1 IMPLANT
GLOVE BIOGEL PI INDICATOR 8 (GLOVE) ×2
GLOVE SURG SYN 7.5  E (GLOVE) ×2
GLOVE SURG SYN 7.5 E (GLOVE) ×1 IMPLANT
GLOVE SURG SYN 7.5 PF PI (GLOVE) ×1 IMPLANT
GOWN STRL REUS W/ TWL LRG LVL3 (GOWN DISPOSABLE) ×1 IMPLANT
GOWN STRL REUS W/ TWL XL LVL3 (GOWN DISPOSABLE) ×1 IMPLANT
GOWN STRL REUS W/TWL LRG LVL3 (GOWN DISPOSABLE) ×2
GOWN STRL REUS W/TWL XL LVL3 (GOWN DISPOSABLE) ×2
GUIDEROD T2 3X1000 (ROD) ×2 IMPLANT
K-WIRE  3.2X450M STR (WIRE) ×4
K-WIRE 3.2X450M STR (WIRE) ×2
KIT PATIENT CARE HANA TABLE (KITS) ×3 IMPLANT
KIT TURNOVER KIT A (KITS) ×3 IMPLANT
KWIRE 3.2X450M STR (WIRE) IMPLANT
MAT ABSORB  FLUID 56X50 GRAY (MISCELLANEOUS) ×2
MAT ABSORB FLUID 56X50 GRAY (MISCELLANEOUS) ×2 IMPLANT
NAIL TROCH GAMMA 3 KT (Nail) ×2 IMPLANT
NDL FILTER BLUNT 18X1 1/2 (NEEDLE) ×1 IMPLANT
NEEDLE FILTER BLUNT 18X 1/2SAF (NEEDLE) ×2
NEEDLE FILTER BLUNT 18X1 1/2 (NEEDLE) ×1 IMPLANT
NEEDLE HYPO 22GX1.5 SAFETY (NEEDLE) ×3 IMPLANT
NS IRRIG 1000ML POUR BTL (IV SOLUTION) ×3 IMPLANT
PACK HIP COMPR (MISCELLANEOUS) ×3 IMPLANT
PENCIL ELECTRO HAND CTR (MISCELLANEOUS) ×3 IMPLANT
SCREW LAG GAMMA 3 TI 10.5X100M (Screw) ×2 IMPLANT
SCREW LOCKING T2 F/T  5MMX35MM (Screw) ×2 IMPLANT
SCREW LOCKING T2 F/T 5MMX35MM (Screw) IMPLANT
STAPLER SKIN PROX 35W (STAPLE) ×3 IMPLANT
SUT VIC AB 2-0 CT2 27 (SUTURE) ×3 IMPLANT
SYR 10ML LL (SYRINGE) ×3 IMPLANT
SYR 30ML LL (SYRINGE) ×3 IMPLANT
TAPE CLOTH 3X10 WHT NS LF (GAUZE/BANDAGES/DRESSINGS) ×6 IMPLANT

## 2019-01-06 NOTE — Plan of Care (Signed)
No acute events overnight. Pain managed with PRN pain medication. Plan for OR in am. All safety measures in place, will continue to monitor Problem: Education: Goal: Knowledge of General Education information will improve Description: Including pain rating scale, medication(s)/side effects and non-pharmacologic comfort measures Outcome: Progressing   Problem: Health Behavior/Discharge Planning: Goal: Ability to manage health-related needs will improve Outcome: Progressing   Problem: Clinical Measurements: Goal: Ability to maintain clinical measurements within normal limits will improve Outcome: Progressing Goal: Will remain free from infection Outcome: Progressing Goal: Diagnostic test results will improve Outcome: Progressing Goal: Respiratory complications will improve Outcome: Progressing Goal: Cardiovascular complication will be avoided Outcome: Progressing   Problem: Activity: Goal: Risk for activity intolerance will decrease Outcome: Progressing   Problem: Nutrition: Goal: Adequate nutrition will be maintained Outcome: Progressing   Problem: Coping: Goal: Level of anxiety will decrease Outcome: Progressing   Problem: Elimination: Goal: Will not experience complications related to bowel motility Outcome: Progressing Goal: Will not experience complications related to urinary retention Outcome: Progressing   Problem: Pain Managment: Goal: General experience of comfort will improve Outcome: Progressing   Problem: Safety: Goal: Ability to remain free from injury will improve Outcome: Progressing   Problem: Skin Integrity: Goal: Risk for impaired skin integrity will decrease Outcome: Progressing

## 2019-01-06 NOTE — Consult Note (Addendum)
[facility] TeleNeurology Consult Services   Date of Service:   01/03/2019 20:55:32  Impression:     .  Right Hemispheric Infarct     .  MCA Distribution Infarct     .  Right ICA occlusion  Comments/Sign-Out: Patient clinical exam is suggestive of large right MCA distribution stroke. She is not a candidate for IV TPA because of recent hip surgery and the fact that she was on anticoagulants until yesterday. Her CTA showed a right ICA occlusion. The case was discussed with neuro hospitalist at [facility]. Also the case was discussed with the hospitalist NP Ms Levada Dy. Patient is a possible candidate for mechanical thrombectomy. Neuro hospitalist at [facility] has requested a CT perfusion and to make sure that the family is agreeable with intervention. The team at the bedside was informed about the request and were told to contact the transfer center once these 2 things are done for transferring the patient to [facility]. I did order CTP myself to expedite the process.Even if patient is not a candidate for thrombectomy, would need to be transferred for neuro critical care monitoring. All this was discussed with the Staff at the bedside and they were told to call me if they have any questions or problems.  Mechanism of Stroke: Possible Cardioembolic  Metrics: Last Known Well: 12/19/2018 17:30:00 [employer] Notification Time: 12/22/2018 20:55:32 Stamp Time: 01/02/2019 20:55:32 Time First Login Attempt: 12/31/2018 21:03:27 Video Start Time: 12/08/2018 21:03:27  Symptoms: Left sided weakness NIHSS Start Assessment Time: 12/15/2018 21:09:07 Patient is not a candidate for tPA. Patient was not deemed candidate for tPA thrombolytics because of Use of NOAs within 48 hours. Video End Time: 01/05/2019 21:35:21  CT head showed no acute hemorrhage or acute core infarct. CT head was reviewed and results were: Dense right MCA sign with CTA confirming R M1  occlusion  Clinical Presentation is Suggestive of Large Vessel Occlusive Disease, Recommendations are as Follows  CTA Head and Neck. CT Perfusion. Advanced Imaging is Suggestive of Large Vessel Occlusion, Neurointerventional Specialist to be Consulted.   Discussed with Neurointerventionalist on 01/05/2019 21:19:47  Our recommendations are outlined below.  Recommendations:     .  Activate Stroke Protocol Admission/Order Set     .  Stroke/Telemetry Floor     .  Neuro Checks     .  Bedside Swallow Eval     .  DVT Prophylaxis     .  IV Fluids, Normal Saline     .  Head of Bed 30 Degrees     .  Euglycemia and Avoid Hyperthermia (PRN Acetaminophen)     .  Antiplatelet Therapy Recommended  Routine Consultation with [facility] for Follow up Care  Sign Out:     .  Discussed with Primary Attending     .  Discussed with Rapid Response Team    ------------------------------------------------------------------------------  History of Present Illness: Patient is a 85 year old Female.  Inpatient stroke alert was called for symptoms of Left sided weakness  85 year old female admitted to the hospital for a right hip fracture and underwent surgery this morning. Postoperatively she was noted to have sudden onset left-sided weakness with mental status changes. Stroke alert was called. Patient was on Eliquis. Apparently at home she is independent and was working in her flower bed yesterday when she fell and fractured her right hip. Has been off Eliquis since yesterday. Went for surgery today. Postoperatively developed some left-sided weakness. She is unable to provide  much history and history is obtained from her records and the staff at the bedside.  Last seen normal was within 4.5 hours. There is no history of hemorrhagic complications or intracranial hemorrhage. There is history of Recent Anticoagulants. There is no history of recent major surgery. There is no history of recent  stroke.  Examination: 1A: Level of Consciousness - Arouses to minor stimulation + 1 1B: Ask Month and Age - 1 Question Right + 1 1C: Blink Eyes & Squeeze Hands - Performs Both Tasks + 0 2: Test Horizontal Extraocular Movements - Forced Gaze Palsy: Cannot Be Overcome + 2 3: Test Visual Fields - Partial Hemianopia + 1 4: Test Facial Palsy (Use Grimace if Obtunded) - Minor paralysis (flat nasolabial fold, smile asymmetry) + 1 5A: Test Left Arm Motor Drift - No Effort Against Gravity + 3 5B: Test Right Arm Motor Drift - No Drift for 10 Seconds + 0 6A: Test Left Leg Motor Drift - No Effort Against Gravity + 3 6B: Test Right Leg Motor Drift - No Drift for 5 Seconds + 0 7: Test Limb Ataxia (FNF/Heel-Shin) - No Ataxia + 0 8: Test Sensation - Mild-Moderate Loss: Less Sharp/More Dull + 1 9: Test Language/Aphasia - Normal; No aphasia + 0 10: Test Dysarthria - Severe Dysarthria: Unintelligble Slurring or Out of Proportion to Aphasia + 2 11: Test Extinction/Inattention - Visual/tactile/auditory/spatial/personal inattention + 1  NIHSS Score: 16  Patient/Family was informed the Neurology Consult would happen via TeleHealth consult by way of interactive audio and video telecommunications and consented to receiving care in this manner.  Due to the immediate potential for life-threatening deterioration due to underlying acute neurologic illness, I spent 45 minutes providing critical care. This time includes time for face to face visit via telemedicine, review of medical records, imaging studies and discussion of findings with providers, the patient and/or family.   Dr Faustino Congress   [facility] [phone]  Case [id number] Addendum: Talked with Radiology regarding CTP results. Patient with a large right MCA stroke (mismatch of 120 with significant core). Discussed with staff . Hospitalist CRNP Ms Levada Dy discussing the case with Dr Londell Moh. Family Agreeable with thrombectomy . Decision regarding  NIR will depend on [facility] team.

## 2019-01-06 NOTE — Progress Notes (Signed)
Family notified of patients' change im mentation and facial drooping. Family acknowledged.

## 2019-01-06 NOTE — H&P (Signed)
H&P reviewed. No significant changes noted.  

## 2019-01-06 NOTE — Transfer of Care (Signed)
Immediate Anesthesia Transfer of Care Note  Patient: Kelly Swanson  Procedure(s) Performed: INTRAMEDULLARY (IM) NAIL INTERTROCHANTRIC (Right Hip)  Patient Location: PACU  Anesthesia Type:General  Level of Consciousness: awake and patient cooperative  Airway & Oxygen Therapy: Patient Spontanous Breathing and Patient connected to face mask oxygen  Post-op Assessment: Report given to RN and Post -op Vital signs reviewed and stable  Post vital signs: Reviewed and stable  Last Vitals:  Vitals Value Taken Time  BP 145/80 01/02/2019 1325  Temp 36.8 C 12/09/2018 1325  Pulse 70 12/25/2018 1325  Resp 11 01/04/2019 1325  SpO2 100 % 12/14/2018 1325  Vitals shown include unvalidated device data.  Last Pain:  Vitals:   12/24/2018 1103  TempSrc: Temporal  PainSc: 7       Patients Stated Pain Goal: 3 (46/21/94 7125)  Complications: No apparent anesthesia complications

## 2019-01-06 NOTE — Anesthesia Procedure Notes (Signed)
Procedure Name: Intubation Performed by: Kelton Pillar, CRNA Pre-anesthesia Checklist: Patient identified, Emergency Drugs available, Suction available and Patient being monitored Patient Re-evaluated:Patient Re-evaluated prior to induction Oxygen Delivery Method: Circle system utilized Preoxygenation: Pre-oxygenation with 100% oxygen Induction Type: IV induction Ventilation: Mask ventilation without difficulty Laryngoscope Size: McGraph and 3 Grade View: Grade I Tube type: Oral Tube size: 6.0 mm Airway Equipment and Method: Stylet Placement Confirmation: ETT inserted through vocal cords under direct vision,  positive ETCO2,  CO2 detector and breath sounds checked- equal and bilateral Secured at: 21 cm Tube secured with: Tape Dental Injury: Teeth and Oropharynx as per pre-operative assessment

## 2019-01-06 NOTE — Progress Notes (Signed)
[place]- on-call neurologist note Phone call via [facility]. Called for transfer due to stroke symptoms experienced by the patient's at [facility]. Patient evaluated by telemedicine neurology. Not a candidate for IV TPA due to recent hip surgery a few hours ago CT with aspects 7-8.  CTA with complete right ICA and right MCA occlusion. Recommended CT perfusion study that shows a large score of 87 cc in the right ICA MCA territory. Discussed with endovascular-Dr. Deveshwar-deemed not to be a good candidate for intervention with a large core. Discussed my findings over the phone with the patient's daughter in detail and explained why intervention would not be advisable at this time. Patient should irrespective be transferred to [facility], it being the comprehensive stroke center should the patient require any neurosurgical intervention such as hemicraniectomy given the large size of the stroke and dedicated stroke neurology team at [facility]. Primary team-Angela, NP notified to arrange for transfer from ICU to ICU to critical care medicine. [facility] will consult once the patient arrives. Please call neurology with questions  -- Amie Portland, MD [facility] Pager: [phone] If 7pm to 7am, please call on call as listed on AMION.

## 2019-01-06 NOTE — Op Note (Signed)
DATE OF SURGERY: 12/11/2018  PREOPERATIVE DIAGNOSIS: Right intertrochanteric hip fracture  POSTOPERATIVE DIAGNOSIS: Right intertrochanteric hip fracture  PROCEDURE: Intramedullary nailing of right femur with cephalomedullary device  SURGEON: Cato Mulligan, MD  ASSISTANTS: none  EBL: 100 cc  COMPONENTS:  [employer] Short Gamma Nail: 11x155mm; 129mm lag screw; 54mm distal interlocking screw   INDICATIONS: Kelly Swanson is a 85 y.o. female who sustained an intertrochanteric fracture after a fall. Risks and benefits of intramedullary nailing were explained to the patient and her daughters. Risks include but are not limited to bleeding, infection, injury to tissues, nerves, vessels, periprosthetic infection, DVT/PE, malunion/nonunion, and risks of anesthesia. The patient and family understand these risks, have completed an informed consent, and wish to proceed.   PROCEDURE:  The patient was brought into the operating room. After administering anesthesia, the patient was placed in the supine position on the Hana table. The uninjured leg was extended while the injured lower extremity was placed in longitudinal traction. The fracture was reduced using longitudinal traction and internal rotation. The adequacy of reduction was verified fluoroscopically in AP and lateral projections and found to be near anatomic. The lateral aspects of the operative hip and thigh were prepped with ChloraPrep solution before being draped sterilely. Preoperative IV antibiotics were administered. A timeout was performed to verify the appropriate surgical site, patient, and procedure.   The greater trochanter was identified and an approximately 6cm incision was made about 3 fingerbreadths above the tip of the greater trochanter. The incision was carried down through the subcutaneous tissues to expose the gluteal fascia. This was split the length of the incision, providing access to the tip of the trochanter. Under  fluoroscopic guidance, a guidewire was drilled through the tip of the trochanter into the proximal metaphysis to the level of the lesser trochanter. After verifying its position fluoroscopically in AP and lateral projections, it was overreamed with the opening reamer to the level of the lesser trochanter. A guidewire was passed down through the femoral canal. The adequacy of guidewire position was verified fluoroscopically in AP and lateral projections. The guidewire was overreamed sequentially using the flexible reamers. The Stryker Short Gamma Nail was advanced to the appropriate depth as verified fluoroscopically.    The guide system for the lag screw was positioned and advanced through an approximately 4 cm stab incision over the lateral aspect of the proximal femur. The guidewire was drilled up through the femoral nail and into the femoral neck to rest within 5 mm of subchondral bone. After verifying its position in the femoral neck and head in both AP and lateral projections, the guidewire was measured and was overreamed to the appropriate depth before the lag screw was inserted. On advancement, it was noted that the reduction had been lost. The screw was backed out and an antirotation pin was placed superior and parallel to the trajectory of the lag screw. The lag screw was then advanced to the appropriate depth as verified fluoroscopically in AP and lateral projections. This allowed for maintained appropriate reduction. The set screw was tightened and then untightened a quarter turn to allow for compression. Again, the adequacy of hardware position and fracture reduction was verified fluoroscopically in AP and lateral projections.   Attention was directed distally and a 3cm incision was made for the distal interlocking screw. The aiming arm was used to drill and an appropriately sized screw was selected and advanced. There was excellent purchase of the screw. Adequacy of screw position was verified  fluoroscopically in AP and lateral projections.   The wounds were irrigated thoroughly with sterile saline solution. Local anesthetic was injected into the wounds.The deep gluteal fascia and IT band split were closed with 0-vicryl. Subcutaneous tissues were closed using 2-0 Vicryl interrupted sutures. The skin was closed using staples. Sterile occlusive dressings were applied to all wounds. The patient was then transferred to the recovery room in satisfactory condition after tolerating the procedure well.  POSTOPERATIVE PLAN: The patient will be WBAT on the operative extremity. Resume home Eliquis on POD#1. Ancef x 24 hours. PT/OT on POD#1.

## 2019-01-06 NOTE — Progress Notes (Signed)
[facility] [place] NAME: Kelly Swanson    MR#:  [id number]  DATE OF BIRTH:  14-Mar-1932  SUBJECTIVE:  CHIEF COMPLAINT:   Chief Complaint  Patient presents with  . Fall   -Came in after a fall and right hip fracture.  Complains of significant pain in the hip.  For surgery today -Appears to be slightly confused  REVIEW OF SYSTEMS:  Review of Systems  Constitutional: Negative for chills and fever.  HENT: Negative for congestion, ear discharge, hearing loss and nosebleeds.   Respiratory: Negative for cough, shortness of breath and wheezing.   Cardiovascular: Negative for chest pain and palpitations.  Gastrointestinal: Negative for abdominal pain, constipation, diarrhea, nausea and vomiting.  Genitourinary: Negative for dysuria.  Musculoskeletal: Positive for joint pain and myalgias.  Neurological: Negative for dizziness, focal weakness, seizures, weakness and headaches.  Psychiatric/Behavioral: Negative for depression. The patient is nervous/anxious.     DRUG ALLERGIES:  No Known Allergies  VITALS:  Blood pressure (!) 141/78, pulse 70, temperature 98.9 F (37.2 C), temperature source Oral, resp. rate 19, weight 65.3 kg, SpO2 97 %.  PHYSICAL EXAMINATION:  Physical Exam   GENERAL:  85 y.o.-year-old patient lying in the bed with no acute distress.  EYES: Left pupil equal, round, reactive to light and accommodation.  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  No scleral icterus. Extraocular muscles intact.  HEENT: Head atraumatic, normocephalic. Oropharynx and nasopharynx clear.  NECK:  Supple, no jugular venous distention. No thyroid enlargement, no tenderness.  LUNGS: Normal breath sounds bilaterally, no wheezing, rales,rhonchi or crepitation. No use of accessory muscles of respiration.  Decreased bibasilar breath sounds CARDIOVASCULAR: S1, S2 normal. No  rubs, or gallops.  2/6 systolic murmur is present ABDOMEN: Soft, nontender,  nondistended. Bowel sounds present. No organomegaly or mass.  EXTREMITIES: No  cyanosis, or clubbing.  Chronic 2+ bilateral pedal edema noted.  Right leg is abducted and externally rotated. NEUROLOGIC: Cranial nerves II through XII are intact. Muscle strength 5/5 in all extremities except right leg due to pain. Sensation intact. Gait not checked.  PSYCHIATRIC: The patient is alert and oriented x 3.  SKIN: No obvious rash, lesion, or ulcer.    LABORATORY PANEL:   CBC Recent Labs  Lab 12/22/2018 0407  WBC 8.2  HGB 11.9*  HCT 37.2  PLT 452*   ------------------------------------------------------------------------------------------------------------------  Chemistries  Recent Labs  Lab 01/04/2019 1130 12/19/2018 0407  NA 143 142  K 3.8 3.8  CL 106 107  CO2 28 26  GLUCOSE 142* 115*  BUN 21 20  CREATININE 0.96 0.87  CALCIUM 9.4 9.1  AST 35  --   ALT 26  --   ALKPHOS 46  --   BILITOT 0.7  --    ------------------------------------------------------------------------------------------------------------------  Cardiac Enzymes No results for input(s): TROPONINI in the last 168 hours. ------------------------------------------------------------------------------------------------------------------  RADIOLOGY:  Dg Chest 1 View  Result Date: 12/27/2018 CLINICAL DATA:  RIGHT hip fracture. EXAM: CHEST  1 VIEW COMPARISON:  10/04/2014 FINDINGS: Cardiomegaly and mitral valve replacement again noted. LEFT pacemaker identified with 3 leads. There is no evidence of focal airspace disease, pulmonary edema, suspicious pulmonary nodule/mass, pleural effusion, or pneumothorax. No acute bony abnormalities are identified. IMPRESSION: Cardiomegaly without evidence of acute cardiopulmonary disease. Electronically Signed   By: Margarette Canada M.D.   On: 01/04/2019 12:28   Dg Hip Unilat W Or Wo Pelvis 2-3 Views Right  Result Date: 01/02/2019 CLINICAL DATA:  Acute RIGHT hip pain  following fall. Initial  encounter. EXAM: DG HIP (WITH OR WITHOUT PELVIS) 2-3V RIGHT COMPARISON:  None. FINDINGS: A comminuted intertrochanteric RIGHT femur fracture is noted with varus angulation. No dislocation. Surgical hardware within the proximal LEFT femur noted. No suspicious focal bony lesions are identified. IMPRESSION: Comminuted intertrochanteric RIGHT femur fracture with varus angulation. Electronically Signed   By: Margarette Canada M.D.   On: 12/14/2018 12:23    EKG:   Orders placed or performed during the hospital encounter of 12/20/2018  . ED EKG  . ED EKG    ASSESSMENT AND PLAN:   Kelly Swanson  is a 85 y.o. female with a known history of atrial fibrillation status post prior ablation and cardioversion, on Eliquis, status post pacemaker, chronic heart failure, mitral valve disease status post annuloplasty, seizure disorder, anemia of chronic disease and AAA of 3.4 cm presents to hospital secondary to mechanical fall at home and right hip fracture.  1.  Comminuted right intertrochanteric fracture-Ortho has been consulted. -Admit to the floor.  Pain control -Patient has cardiac history but were asymptomatic.  She is considered moderate risk for noncardiac surgery-can proceed, no further work-up necessary -Hold Eliquis.  scheduled for surgery late morning today -PT and Eliquis can be restarted after surgery.  Monitor hemoglobin postop for anemia  2.  Acute cystitis-follow-up urine cultures and  on Rocephin  3.  Atrial fibrillation-rate controlled.  Continue Cardizem orally and metoprolol.  - Hold anticoagulation eliquis at this time.  Patient is status post pacemaker and follows with [facility] cardiology  4.  Seizure disorder-no recent history of seizures.  Continue Dilantin and phenobarbital.  5.  DVT prophylaxis-will be started after surgery   Left a voicemail for daughter June this morning with updates      All the records are reviewed and case discussed with Care Management/Social Workerr.  Management plans discussed with the patient, family and they are in agreement.  CODE STATUS: Full code  TOTAL TIME TAKING CARE OF THIS PATIENT: 38 minutes.   POSSIBLE D/C IN 3 DAYS, DEPENDING ON CLINICAL CONDITION.   Gladstone Lighter M.D on 12/30/2018 at 8:57 AM  Between 7am to 6pm - Pager - [phone]  After 6pm go to [email or web address] - password [email or web address] [place] [facility]  Office  [phone]  CC: Primary care physician; Adin Hector, MD

## 2019-01-06 NOTE — Anesthesia Preprocedure Evaluation (Signed)
Anesthesia Evaluation  Patient identified by MRN, date of birth, ID band Patient awake    Reviewed: Allergy & Precautions, H&P , NPO status , Patient's Chart, lab work & pertinent test results  History of Anesthesia Complications Negative for: history of anesthetic complications  Airway Mallampati: III  TM Distance: >3 FB Neck ROM: limited    Dental  (+) Chipped   Pulmonary neg shortness of breath, former smoker,           Cardiovascular Exercise Tolerance: Good (-) angina+CHF  (-) Past MI and (-) DOE + pacemaker      Neuro/Psych Seizures -, Well Controlled,  negative psych ROS   GI/Hepatic negative GI ROS, Neg liver ROS, neg GERD  ,  Endo/Other  negative endocrine ROS  Renal/GU      Musculoskeletal  (+) Arthritis ,   Abdominal   Peds  Hematology negative hematology ROS (+)   Anesthesia Other Findings Past Medical History: No date: AAA (abdominal aortic aneurysm) (HCC)     Comment:  3.4cm No date: Anemia No date: Ankle fracture No date: Atrial fibrillation (HCC)     Comment:  on eliquis No date: CHF (congestive heart failure) (HCC) No date: Colon polyps No date: DJD (degenerative joint disease) No date: Hyperlipidemia No date: Leukopenia No date: Mitral valve regurgitation     Comment:  s/p mitral annuloplasty No date: Osteoporosis No date: Recurrent UTI No date: Right eye trauma No date: Seizures ([place])  Past Surgical History: No date: ABDOMINAL HYSTERECTOMY No date: CLOSED REDUCTION HUMERAL SHAFT FRACTURE; Left No date: CLOSED REDUCTION RADIAL SHAFT FRACTURE; Right 03/31/2016: ELECTROPHYSIOLOGIC STUDY; N/A     Comment:  Procedure: CARDIOVERSION;  Surgeon: Isaias Cowman,              MD;  Location: [facility] ORS;  Service: Cardiovascular;                Laterality: N/A; No date: EYE SURGERY No date: FOOT SURGERY; Left No date: hip nailing; Left No date: MITRAL VALVULOPLASTY No date: ORIF WRIST  FRACTURE; Left  BMI    Body Mass Index: 26.33 kg/m      Reproductive/Obstetrics negative OB ROS                             Anesthesia Physical Anesthesia Plan  ASA: III  Anesthesia Plan: General ETT   Post-op Pain Management:    Induction: Intravenous  PONV Risk Score and Plan: Ondansetron, Dexamethasone, Midazolam and Treatment may vary due to age or medical condition  Airway Management Planned: Oral ETT  Additional Equipment:   Intra-op Plan:   Post-operative Plan: Extubation in OR  Informed Consent: I have reviewed the patients History and Physical, chart, labs and discussed the procedure including the risks, benefits and alternatives for the proposed anesthesia with the patient or authorized representative who has indicated his/her understanding and acceptance.     Dental Advisory Given  Plan Discussed with: Anesthesiologist, CRNA and Surgeon  Anesthesia Plan Comments: (Patient consented for risks of anesthesia including but not limited to:  - adverse reactions to medications - damage to teeth, lips or other oral mucosa - sore throat or hoarseness - Damage to heart, brain, lungs or loss of life  Patient voiced understanding.)        Anesthesia Quick Evaluation

## 2019-01-06 NOTE — Consult Note (Signed)
ORTHOPAEDIC CONSULTATION  REQUESTING PHYSICIAN: Gladstone Lighter, MD  Chief Complaint:   R hip pain  History of Present Illness: Kelly Swanson is a 85 y.o. female who had a fall at home yesterday.  The patient noted immediate hip pain and inability to ambulate.  The patient ambulates unassisted at baseline, takes care of herself, and lives independently. Her daughters live next door to her.  Pain is described as sharp at its worst and a dull ache at its best.  Pain is rated a 10 out of 10 in severity.  Pain is improved with rest and immobilization.  Pain is worse with any sort of movement.  X-rays in the emergency department show a right intertrochanteric hip fracture.  She is on Eliquis with last dose yesterday AM. She has a pacemaker and CHF. She has had prior L hip fracture treated with DHS by Dr. Sabra Heck at [facility] in 2013. She had ORIF L distal radius at the same time.  Past Medical History:  Diagnosis Date  . AAA (abdominal aortic aneurysm) (HCC)    3.4cm  . Anemia   . Ankle fracture   . Atrial fibrillation ([place])    on eliquis  . CHF (congestive heart failure) ([place])   . Colon polyps   . DJD (degenerative joint disease)   . Hyperlipidemia   . Leukopenia   . Mitral valve regurgitation    s/p mitral annuloplasty  . Osteoporosis   . Recurrent UTI   . Right eye trauma   . Seizures ([place])    Past Surgical History:  Procedure Laterality Date  . ABDOMINAL HYSTERECTOMY    . CLOSED REDUCTION HUMERAL SHAFT FRACTURE Left   . CLOSED REDUCTION RADIAL SHAFT FRACTURE Right   . ELECTROPHYSIOLOGIC STUDY N/A 03/31/2016   Procedure: CARDIOVERSION;  Surgeon: Isaias Cowman, MD;  Location: [facility] ORS;  Service: Cardiovascular;  Laterality: N/A;  . EYE SURGERY    . FOOT SURGERY Left   . hip nailing Left   . MITRAL VALVULOPLASTY    . ORIF WRIST FRACTURE Left    Social History   Socioeconomic History  . Marital  status: Widowed    Spouse name: Not on file  . Number of children: Not on file  . Years of education: Not on file  . Highest education level: Not on file  Occupational History  . Not on file  Social Needs  . Financial resource strain: Not on file  . Food insecurity    Worry: Not on file    Inability: Not on file  . Transportation needs    Medical: Not on file    Non-medical: Not on file  Tobacco Use  . Smoking status: Former Smoker    Packs/day: 0.25    Years: 30.00    Pack years: 7.50    Types: Cigarettes  . Smokeless tobacco: Never Used  Substance and Sexual Activity  . Alcohol use: Yes    Comment: rare  . Drug use: No  . Sexual activity: Not on file  Lifestyle  . Physical activity    Days per week: Not on file    Minutes per session: Not on file  . Stress: Not on file  Relationships  . Social Herbalist on phone: Not on file    Gets together: Not on file    Attends religious service: Not on file    Active member of club or organization: Not on file    Attends meetings of clubs or organizations: Not  on file    Relationship status: Not on file  Other Topics Concern  . Not on file  Social History Narrative   Lives at home, independent at baseline   Family History  Problem Relation Age of Onset  . Breast cancer Sister   . Liver cancer Brother    No Known Allergies Prior to Admission medications   Medication Sig Start Date End Date Taking? Authorizing Provider  apixaban (ELIQUIS) 5 MG TABS tablet Take 5 mg by mouth 2 (two) times daily.   Yes [provider]  beta carotene w/minerals (OCUVITE) tablet Take 1 tablet by mouth daily.   Yes [provider]  Calcium Carbonate-Vitamin D 600-400 MG-UNIT per tablet Take 1 tablet by mouth daily.    Yes [provider]  diltiazem (CARDIZEM) 60 MG tablet Take 60 mg by mouth 3 (three) times daily.   Yes [provider]  furosemide (LASIX) 20 MG tablet Take 20 mg by mouth daily.    Yes [provider]  metoprolol succinate (TOPROL-XL) 50 MG 24 hr tablet Take 50 mg by mouth 2 (two) times a day.   Yes [provider]  Multiple Vitamins-Minerals (CENTRUM SILVER PO) Take 1 tablet by mouth. Once daily in the morning.   Yes [provider]  PHENobarbital (LUMINAL) 64.8 MG tablet Take 64.8 mg by mouth at bedtime.   Yes [provider]  phenytoin (DILANTIN) 100 MG ER capsule TAKE AS DIRECTED (TOTAL OF 300MG  PER DAY) 02/04/18  Yes [provider]   Recent Labs    12/09/2018 1130 12/22/2018 0407  WBC 11.9* 8.2  HGB 13.3 11.9*  HCT 41.1 37.2  PLT 556* 452*  K 3.8 3.8  CL 106 107  CO2 28 26  BUN 21 20  CREATININE 0.96 0.87  GLUCOSE 142* 115*  CALCIUM 9.4 9.1   Dg Chest 1 View  Result Date: 12/20/2018 CLINICAL DATA:  RIGHT hip fracture. EXAM: CHEST  1 VIEW COMPARISON:  10/04/2014 FINDINGS: Cardiomegaly and mitral valve replacement again noted. LEFT pacemaker identified with 3 leads. There is no evidence of focal airspace disease, pulmonary edema, suspicious pulmonary nodule/mass, pleural effusion, or pneumothorax. No acute bony abnormalities are identified. IMPRESSION: Cardiomegaly without evidence of acute cardiopulmonary disease. Electronically Signed   By: Margarette Canada M.D.   On: 12/15/2018 12:28   Dg Hip Unilat W Or Wo Pelvis 2-3 Views Right  Result Date: 12/10/2018 CLINICAL DATA:  Acute RIGHT hip pain following fall. Initial encounter. EXAM: DG HIP (WITH OR WITHOUT PELVIS) 2-3V RIGHT COMPARISON:  None. FINDINGS: A comminuted intertrochanteric RIGHT femur fracture is noted with varus angulation. No dislocation. Surgical hardware within the proximal LEFT femur noted. No suspicious focal bony lesions are identified. IMPRESSION: Comminuted intertrochanteric RIGHT femur fracture with varus angulation. Electronically Signed   By: Margarette Canada M.D.   On: 12/21/2018 12:23     Positive ROS: All other systems have been reviewed and were  otherwise negative with the exception of those mentioned in the HPI and as above.  Physical Exam: BP 135/70   Pulse 70   Temp 97.6 F (36.4 C) (Temporal)   Resp 20   Ht 5\' 2"  (1.575 m)   Wt 65.3 kg   SpO2 98%   BMI 26.33 kg/m  General:  Alert, no acute distress Psychiatric:  Patient is competent for consent with normal mood and affect   Cardiovascular:  No pedal edema, regular rate and rhythm Respiratory:  No wheezing, non-labored breathing GI:  Abdomen  is soft and non-tender Skin:  No lesions in the area of chief complaint, no erythema Neurologic:  Sensation intact distally, CN grossly intact Lymphatic:  No axillary or cervical lymphadenopathy  Orthopedic Exam:  RLE: 5/5 DF/PF/EHL SILT s/s/t/sp/dp distr Foot wwp +Log roll/axial load   X-rays:  As above: R intertrochanteric hip fracture, well healed L hip fracture with DHS in place  Assessment/Plan: Kelly Swanson is a 85 y.o. female with a R intertrochanteric hip fracture   1. I discussed the various treatment options including both surgical and non-surgical management of her fracture with the patient's daughters (medical PoA). We discussed the high risk of perioperative complications due to patient's age and other co-morbidities. After discussion of risks, benefits, and alternatives to surgery, the family and patient were in agreement to proceed with surgery. The goals of surgery would be to provide adequate pain relief and allow for early mobilization. Plan for surgery is R hip cephalomedullary nailing today, 12/10/2018. 2. NPO until OR 3. Hold anticoagulation in advance of OR          Leim Fabry   12/26/2018 11:25 AM

## 2019-01-06 NOTE — Progress Notes (Signed)
Responded to rapid response. Possible stroke. No interventions by RT at this time.

## 2019-01-06 NOTE — Anesthesia Postprocedure Evaluation (Signed)
Anesthesia Post Note  Patient: Kelly Swanson  Procedure(s) Performed: INTRAMEDULLARY (IM) NAIL INTERTROCHANTRIC (Right Hip)  Patient location during evaluation: PACU Anesthesia Type: General Level of consciousness: awake and alert Pain management: pain level controlled Vital Signs Assessment: post-procedure vital signs reviewed and stable Respiratory status: spontaneous breathing, nonlabored ventilation, respiratory function stable and patient connected to nasal cannula oxygen Cardiovascular status: blood pressure returned to baseline and stable Postop Assessment: no apparent nausea or vomiting Anesthetic complications: no     Last Vitals:  Vitals:   12/30/2018 1413 12/10/2018 1436  BP:  (!) 143/83  Pulse: 71 70  Resp: 15 16  Temp:  36.7 C  SpO2: 100% 100%    Last Pain:  Vitals:   12/26/2018 1436  TempSrc: Oral  PainSc:                  Precious Haws Dusty Raczkowski

## 2019-01-06 NOTE — Progress Notes (Signed)
Upon entering the room patient found very lethargic with left sided weakness, facial drooping and no effort against gravity with neuro checks. Rapid response called and Code Stroke initiated.

## 2019-01-06 NOTE — Progress Notes (Addendum)
Pt is difficult to arouse. Looks droopy on L side of face. Does not know where she is. This is a change in orientation. MD paged. New order for neuro checks ant head CT.

## 2019-01-06 NOTE — Anesthesia Post-op Follow-up Note (Signed)
Anesthesia QCDR form completed.        

## 2019-01-07 LAB — BASIC METABOLIC PANEL
Anion gap: 8 (ref 5–15)
BUN: 17 mg/dL (ref 8–23)
CO2: 26 mmol/L (ref 22–32)
Calcium: 8.8 mg/dL — ABNORMAL LOW (ref 8.9–10.3)
Chloride: 106 mmol/L (ref 98–111)
Creatinine, Ser: 0.72 mg/dL (ref 0.44–1.00)
GFR calc Af Amer: >60 mL/min (ref >60)
GFR calc non Af Amer: >60 mL/min (ref >60)
Glucose, Bld: 155 mg/dL — ABNORMAL HIGH (ref 70–99)
Potassium: 4 mmol/L (ref 3.5–5.1)
Sodium: 140 mmol/L (ref 135–145)

## 2019-01-07 LAB — CBC
HCT: 34.5 % — ABNORMAL LOW (ref 36.0–46.0)
Hemoglobin: 11.1 g/dL — ABNORMAL LOW (ref 12.0–15.0)
MCH: 31.5 pg (ref 26.0–34.0)
MCHC: 32.2 g/dL (ref 30.0–36.0)
MCV: 98 fL (ref 80.0–100.0)
Platelets: 437 10*3/uL — ABNORMAL HIGH (ref 150–400)
RBC: 3.52 MIL/uL — ABNORMAL LOW (ref 3.87–5.11)
RDW: 14.1 % (ref 11.5–15.5)
WBC: 11.4 10*3/uL — ABNORMAL HIGH (ref 4.0–10.5)
nRBC: 0 % (ref 0.0–0.2)

## 2019-01-07 LAB — HEMOGLOBIN A1C
Hgb A1c MFr Bld: 5.5 % (ref 4.8–5.6)
Mean Plasma Glucose: 111.15 mg/dL

## 2019-01-07 LAB — LIPID PANEL
Cholesterol: 152 mg/dL (ref 0–200)
HDL: 66 mg/dL (ref >40)
LDL Cholesterol: 74 mg/dL (ref 0–99)
Total CHOL/HDL Ratio: 2.3 RATIO
Triglycerides: 62 mg/dL (ref <150)
VLDL: 12 mg/dL (ref 0–40)

## 2019-01-07 LAB — URINE CULTURE: Culture: >=100000 — AB

## 2019-01-07 LAB — GLUCOSE, CAPILLARY: Glucose-Capillary: 116 mg/dL — ABNORMAL HIGH (ref 70–99)

## 2019-01-07 LAB — PHENOBARBITAL LEVEL: Phenobarbital: 12.6 ug/mL — ABNORMAL LOW (ref 15.0–30.0)

## 2019-01-07 LAB — PROTIME-INR
INR: 1.1 (ref 0.8–1.2)
Prothrombin Time: 14.1 seconds (ref 11.4–15.2)

## 2019-01-07 MED ORDER — PHENYTOIN SODIUM 50 MG/ML IJ SOLN
100.0000 mg | Freq: Three times a day (TID) | INTRAMUSCULAR | Status: DC
  Administered 2019-01-07 – 2019-01-11 (×10): 100 mg via INTRAVENOUS
  Filled 2019-01-07 (×17): qty 2

## 2019-01-07 MED ORDER — DILTIAZEM HCL 25 MG/5ML IV SOLN: 5.0000 mg | Freq: Four times a day (QID) | INTRAVENOUS | Status: DC | PRN

## 2019-01-07 MED ORDER — APIXABAN 5 MG PO TABS: 5.0000 mg | ORAL_TABLET | Freq: Two times a day (BID) | ORAL | Status: DC

## 2019-01-07 MED ORDER — ATORVASTATIN CALCIUM 20 MG PO TABS: 80.0000 mg | ORAL_TABLET | Freq: Every day | ORAL | Status: DC

## 2019-01-07 MED ORDER — PHENOBARBITAL SODIUM 65 MG/ML IJ SOLN
65.0000 mg | Freq: Every day | INTRAMUSCULAR | Status: DC
  Administered 2019-01-07 – 2019-01-10 (×5): 65 mg via INTRAVENOUS
  Filled 2019-01-07 (×5): qty 1

## 2019-01-07 MED ORDER — ACETAMINOPHEN 650 MG RE SUPP: 650.0000 mg | Freq: Four times a day (QID) | RECTAL | Status: DC | PRN

## 2019-01-07 MED ORDER — DILTIAZEM HCL 25 MG/5ML IV SOLN: 15.0000 mg | Freq: Four times a day (QID) | INTRAVENOUS | Status: DC

## 2019-01-07 MED ORDER — PANTOPRAZOLE SODIUM 40 MG IV SOLR
40.0000 mg | INTRAVENOUS | Status: DC
  Administered 2019-01-07: 06:00:00 40 mg via INTRAVENOUS
  Filled 2019-01-07: qty 40

## 2019-01-07 MED ORDER — FENTANYL CITRATE (PF) 100 MCG/2ML IJ SOLN: 12.5000 ug | INTRAMUSCULAR | Status: DC | PRN

## 2019-01-07 MED ORDER — ACETAMINOPHEN 10 MG/ML IV SOLN
1000.0000 mg | Freq: Three times a day (TID) | INTRAVENOUS | Status: DC
  Administered 2019-01-07: 03:00:00 1000 mg via INTRAVENOUS
  Filled 2019-01-07 (×3): qty 100

## 2019-01-07 MED ORDER — CHLORHEXIDINE GLUCONATE CLOTH 2 % EX PADS
6.0000 | MEDICATED_PAD | Freq: Every day | CUTANEOUS | Status: DC
  Administered 2019-01-07: 6 via TOPICAL

## 2019-01-07 MED ORDER — MORPHINE 100MG IN NS 100ML (1MG/ML) PREMIX INFUSION
5.0000 mg/h | INTRAVENOUS | Status: DC
  Administered 2019-01-07 – 2019-01-11 (×6): 5 mg/h via INTRAVENOUS
  Filled 2019-01-07 (×6): qty 100

## 2019-01-07 NOTE — Progress Notes (Signed)
Both daughters came to visit the patient.  They do not want feeding tube or invasive measures.  They requested comfort care for their mother.  The doctor spoke with them and both the RN and MD answered their questions and gave emotional support.  Both adult daughters work in the same office and requested a note for work, so they could get time to visit their mother.  A note will be given to them.  Phillis Knack, RN

## 2019-01-07 NOTE — Progress Notes (Signed)
Patient code status changed to DNR/DNI. Family content with decision and said to call if anything changes. Patient will be in the ICU overnight for monitoring.

## 2019-01-07 NOTE — Consult Note (Signed)
PULMONARY / CRITICAL CARE MEDICINE  Name: Kelly Swanson MRN: [id number] DOB: [birth year]    LOS: 2  Referring Provider: Dr  Rory Percy Reason for Referral:  Acute right MCA stroke Brief patient description:  85 y/o female with a h/o Afib on eliquis, admitted with a right hip fracture s/p mechanical fall at home, and underwent a right hip repair on 12/30/2018.  Postoperatively, patient developed acute left-sided paralysis and imaging studies revealed complete occlusion of the right MCA and right ICA.  HPI: This is an 85 year old Caucasian female with a medical history that is significant for atrial fibrillation on Eliquis, congestive heart failure, hyperlipidemia, mitral valve regurgitation status post mitral annuloplasty, previous left hip fracture and seizure disorder who presented to the ED following a mechanical fall that occurred while she was working outside in her yard.  Her ED work-up revealed a comminuted right intertrochanteric fracture.  She underwent surgical repair on 12/15/2018.  Postoperatively, she was noted to have left-sided paralysis.  Stat CT head and a CT perfusion study of the head and neck revealed a massive right MCA stroke with a complete occlusion of the right MCA and ICA.  She was seen and evaluated by tele-neurology by Dr. Mike Craze and Dr. Rory Percy.  Due to her recent right hip surgery she was excluded from TPA.  Her CT perfusion studies showed a large embolus with a large core of 87 cc in the right ICA and MCA territories.  Per Dr. Elnora Morrison discussion with endovascular surgery, patient was not deemed a candidate for thrombectomy due to the large clot burden.  However, it was recommended that patient be transferred to [facility] in case she develops severe cerebral edema requiring hemicraniectomy given the large size of the stroke.  After discussion with patient's daughters Juliann Pulse and June, they indicated that they will not want any aggressive treatments or surgical interventions that  will not improve the patient's quality of life.  They would like the patient's care to be refocused on conservative measures that are geared at improving her quality of life and keeping her comfortable.  As such the transfer to [facility] was aborted and patient was kept here in the ICU.  She remains arousable with complete left-sided flaccidity and garbled speech.  She is maintaining her airway with an SPO2 of 100% on 2 L nasal cannula.  Past Medical History:  Diagnosis Date  . AAA (abdominal aortic aneurysm) (HCC)    3.4cm  . Anemia   . Ankle fracture   . Atrial fibrillation ([place])    on eliquis  . CHF (congestive heart failure) ([place])   . Colon polyps   . DJD (degenerative joint disease)   . Hyperlipidemia   . Leukopenia   . Mitral valve regurgitation    s/p mitral annuloplasty  . Osteoporosis   . Recurrent UTI   . Right eye trauma   . Seizures ([place])    Past Surgical History:  Procedure Laterality Date  . ABDOMINAL HYSTERECTOMY    . CLOSED REDUCTION HUMERAL SHAFT FRACTURE Left   . CLOSED REDUCTION RADIAL SHAFT FRACTURE Right   . ELECTROPHYSIOLOGIC STUDY N/A 03/31/2016   Procedure: CARDIOVERSION;  Surgeon: Isaias Cowman, MD;  Location: [facility] ORS;  Service: Cardiovascular;  Laterality: N/A;  . EYE SURGERY    . FOOT SURGERY Left   . hip nailing Left   . INTRAMEDULLARY (IM) NAIL INTERTROCHANTERIC Right 12/13/2018   Procedure: INTRAMEDULLARY (IM) NAIL INTERTROCHANTRIC;  Surgeon: Leim Fabry, MD;  Location: [facility] ORS;  Service: Orthopedics;  Laterality: Right;  . MITRAL VALVULOPLASTY    . ORIF WRIST FRACTURE Left    No current facility-administered medications on file prior to encounter.    Current Outpatient Medications on File Prior to Encounter  Medication Sig  . apixaban (ELIQUIS) 5 MG TABS tablet Take 5 mg by mouth 2 (two) times daily.  . beta carotene w/minerals (OCUVITE) tablet Take 1 tablet by mouth daily.  . Calcium Carbonate-Vitamin D 600-400 MG-UNIT per tablet  Take 1 tablet by mouth daily.   Marland Kitchen diltiazem (CARDIZEM) 60 MG tablet Take 60 mg by mouth 3 (three) times daily.  . furosemide (LASIX) 20 MG tablet Take 20 mg by mouth daily.  . metoprolol succinate (TOPROL-XL) 50 MG 24 hr tablet Take 50 mg by mouth 2 (two) times a day.  . Multiple Vitamins-Minerals (CENTRUM SILVER PO) Take 1 tablet by mouth. Once daily in the morning.  Marland Kitchen PHENobarbital (LUMINAL) 64.8 MG tablet Take 64.8 mg by mouth at bedtime.  . phenytoin (DILANTIN) 100 MG ER capsule TAKE AS DIRECTED (TOTAL OF 300MG PER DAY)    Allergies No Known Allergies  Family History Family History  Problem Relation Age of Onset  . Breast cancer Sister   . Liver cancer Brother    Social History  reports that she has quit smoking. Her smoking use included cigarettes. She has a 7.50 pack-year smoking history. She has never used smokeless tobacco. She reports current alcohol use. She reports that she does not use drugs.  Review Of Systems: Unable to obtain as patient is somnolent with garbled speech  VITAL SIGNS: BP (!) 156/81   Pulse 70   Temp 97.7 F (36.5 C) (Axillary)   Resp 20   Ht _0  (1.575 m)   Wt 65.3 kg   SpO2 100%   BMI 26.33 kg/m   HEMODYNAMICS:    VENTILATOR SETTINGS:    INTAKE / OUTPUT: I/O last 3 completed shifts: In: 1096.5 [P.O.:120; I.V.:878.4; IV Piggyback:98.2] Out: 750 [Urine:650; Blood:100]  PHYSICAL EXAMINATION: General: Well-nourished, well-groomed, in no acute distress HEENT: Head is normocephalic and atraumatic, trachea midline, mild JVD, right eye deformed from previous trauma, left eye with sluggish pupils, and corneal reflexes Neuro: Awakens to voice and noxious stimulus, only able to move right upper extremity, left upper and lower extremity flaccidity, right leg immobilized, speech is garbled, left facial droop and right gaze preference Cardiovascular: Apical pulse irregular, S1-S2, no murmur regurg or gallop, +2 pulses bilaterally, no edema Lungs:  Bilateral breath sounds, diminished in the bases, no wheezes or rhonchi Abdomen: Nondistended, normal bowel sounds in all 4 quadrants, palpation reveals no organomegaly GU: Foley catheter in place, no CVA tenderness Musculoskeletal: Right hip with intact surgical incision, pain with movement of right leg, no joint swelling except at the right hip Skin: Warm and dry, surgical incision intact  LABS:  BMET Recent Labs  Lab 12/14/2018 1130 12/29/2018 0407  NA 143 142  K 3.8 3.8  CL 106 107  CO2 28 26  BUN 21 20  CREATININE 0.96 0.87  GLUCOSE 142* 115*    Electrolytes Recent Labs  Lab 12/25/2018 1130 12/12/2018 0407  CALCIUM 9.4 9.1    CBC Recent Labs  Lab 12/07/2018 1130 12/07/2018 0407  WBC 11.9* 8.2  HGB 13.3 11.9*  HCT 41.1 37.2  PLT 556* 452*    Coag's No results for input(s): APTT, INR in the last 168 hours.  Sepsis Markers No results for input(s): LATICACIDVEN, PROCALCITON, O2SATVEN in the last 168 hours.  ABG No results for input(s): PHART, PCO2ART, PO2ART in the last 168 hours.  Liver Enzymes Recent Labs  Lab 12/24/2018 1130  AST 35  ALT 26  ALKPHOS 46  BILITOT 0.7  ALBUMIN 4.3    Cardiac Enzymes No results for input(s): TROPONINI, PROBNP in the last 168 hours.  Glucose No results for input(s): GLUCAP in the last 168 hours.  Imaging Ct Angio Head W Or Wo Contrast  Result Date: 12/13/2018 CLINICAL DATA:  Code stroke. Altered mental status. Left facial droop. Confusion. EXAM: CT ANGIOGRAPHY HEAD AND NECK TECHNIQUE: Multidetector CT imaging of the head and neck was performed using the standard protocol during bolus administration of intravenous contrast. Multiplanar CT image reconstructions and MIPs were obtained to evaluate the vascular anatomy. Carotid stenosis measurements (when applicable) are obtained utilizing NASCET criteria, using the distal internal carotid diameter as the denominator. CONTRAST:  77m OMNIPAQUE IOHEXOL 350 MG/ML SOLN COMPARISON:   None. FINDINGS: CT HEAD FINDINGS Brain: Suspicion of loss of gray-white differentiation in the right insula and frontoparietal brain. No swelling or hemorrhage. Mild chronic small-vessel ischemic change elsewhere. Vascular: Suspicion of hyperdense right MCA. Skull: Negative Sinuses: Clear Orbits: Globe implant on the right. Review of the MIP images confirms the above findings CTA NECK FINDINGS Aortic arch: Aortic atherosclerosis and tortuosity. No sign of aneurysm or dissection. Branching pattern is normal. Right carotid system: Common carotid artery widely patent to the bifurcation. There is motion degradation at the bifurcation level. Occlusion of the right ICA beyond the bulb. This could be due to embolic occlusion or dissection. Left carotid system: Common carotid artery widely patent to the bifurcation. Carotid bifurcation is normal. No stenosis or irregularity. Cervical ICA is normal. Vertebral arteries: Both vertebral artery origins are widely patent. Both vertebral arteries are patent through the cervical region to the foramen magnum. Skeleton: Ordinary cervical spondylosis. Other neck: No mass or adenopathy. Upper chest: No acute chest disease. Review of the MIP images confirms the above findings CTA HEAD FINDINGS Anterior circulation: Left internal carotid artery is patent through the skull base and siphon region. There is good supply of the left MCA and ACA territories. Patent anterior communicating artery allows supply to the right anterior cerebral artery. Right MCA is completely occluded. Posterior circulation: Both vertebral arteries widely patent to the basilar. No basilar stenosis. Posterior circulation branch vessels are normal. No patent posterior communicating artery seen. Venous sinuses: Patent and normal. Anatomic variants: None significant. Review of the MIP images confirms the above findings IMPRESSION: Complete occlusion of the right ICA. This could be due to a large embolus or dissection.  Complete occlusion of the right middle cerebral artery. Right ACA receives it supply from the patent anterior communicating artery and left carotid system. Left carotid bifurcation is normal. Left intracranial anterior circulation is normal. No posterior circulation pathology seen. Early loss of gray-white differentiation in the right insular region. Aspects would be 7 or 8. These results were called by telephone at the time of interpretation on 12/18/2018 at 9:07 pm to Dr. JSkip Estimable, who verbally acknowledged these results. Electronically Signed   By: MNelson ChimesM.D.   On: 12/08/2018 21:10   Ct Angio Neck W Or Wo Contrast  Result Date: 12/22/2018 CLINICAL DATA:  Code stroke. Altered mental status. Left facial droop. Confusion. EXAM: CT ANGIOGRAPHY HEAD AND NECK TECHNIQUE: Multidetector CT imaging of the head and neck was performed using the standard protocol during bolus administration of intravenous contrast. Multiplanar CT image reconstructions and MIPs were  obtained to evaluate the vascular anatomy. Carotid stenosis measurements (when applicable) are obtained utilizing NASCET criteria, using the distal internal carotid diameter as the denominator. CONTRAST:  83m OMNIPAQUE IOHEXOL 350 MG/ML SOLN COMPARISON:  None. FINDINGS: CT HEAD FINDINGS Brain: Suspicion of loss of gray-white differentiation in the right insula and frontoparietal brain. No swelling or hemorrhage. Mild chronic small-vessel ischemic change elsewhere. Vascular: Suspicion of hyperdense right MCA. Skull: Negative Sinuses: Clear Orbits: Globe implant on the right. Review of the MIP images confirms the above findings CTA NECK FINDINGS Aortic arch: Aortic atherosclerosis and tortuosity. No sign of aneurysm or dissection. Branching pattern is normal. Right carotid system: Common carotid artery widely patent to the bifurcation. There is motion degradation at the bifurcation level. Occlusion of the right ICA beyond the bulb. This could be due  to embolic occlusion or dissection. Left carotid system: Common carotid artery widely patent to the bifurcation. Carotid bifurcation is normal. No stenosis or irregularity. Cervical ICA is normal. Vertebral arteries: Both vertebral artery origins are widely patent. Both vertebral arteries are patent through the cervical region to the foramen magnum. Skeleton: Ordinary cervical spondylosis. Other neck: No mass or adenopathy. Upper chest: No acute chest disease. Review of the MIP images confirms the above findings CTA HEAD FINDINGS Anterior circulation: Left internal carotid artery is patent through the skull base and siphon region. There is good supply of the left MCA and ACA territories. Patent anterior communicating artery allows supply to the right anterior cerebral artery. Right MCA is completely occluded. Posterior circulation: Both vertebral arteries widely patent to the basilar. No basilar stenosis. Posterior circulation branch vessels are normal. No patent posterior communicating artery seen. Venous sinuses: Patent and normal. Anatomic variants: None significant. Review of the MIP images confirms the above findings IMPRESSION: Complete occlusion of the right ICA. This could be due to a large embolus or dissection. Complete occlusion of the right middle cerebral artery. Right ACA receives it supply from the patent anterior communicating artery and left carotid system. Left carotid bifurcation is normal. Left intracranial anterior circulation is normal. No posterior circulation pathology seen. Early loss of gray-white differentiation in the right insular region. Aspects would be 7 or 8. These results were called by telephone at the time of interpretation on 01/04/2019 at 9:07 pm to Dr. JSkip Estimable, who verbally acknowledged these results. Electronically Signed   By: MNelson ChimesM.D.   On: 12/08/2018 21:10   Ct Cerebral Perfusion W Contrast  Result Date: 12/21/2018 CLINICAL DATA:  Right hemisphere stroke  EXAM: CT PERFUSION BRAIN TECHNIQUE: Multiphase CT imaging of the brain was performed following IV bolus contrast injection. Subsequent parametric perfusion maps were calculated using RAPID software. CONTRAST:  433mOMNIPAQUE IOHEXOL 350 MG/ML SOLN COMPARISON:  Earlier same day FINDINGS: CT Brain Perfusion Findings: CBF (<30%) Volume: 8778merfusion (Tmax>6.0s) volume: 207m32msmatch Volume: 120mL81mECTS on noncontrast CT Head: 7 or 8 at 2040 hours today. Infarct Core: 87 mL Infarction Location:Right insula, lateral temporal lobe, temporoparietal junction and frontal operculum. IMPRESSION: Consistent with embolic infarction in the right MCA territory with core infarct of 87 cc including involvement of the insula, lateral temporal lobe, temporoparietal junction and frontal operculum. Considerable penumbra elsewhere throughout the entire right middle cerebral artery territory, consistent with the CTA findings. These results were called by telephone at the time of interpretation on 12/17/2018 at 10:11 pm to Dr. MURTAFaustino Congresso verbally acknowledged these results. Electronically Signed   By: Mark Nelson Chimes   On:  12/19/2018 22:12   Dg Hip Operative Unilat W Or W/o Pelvis Right  Result Date: 12/21/2018 CLINICAL DATA:  Right hip surgery. EXAM: OPERATIVE RIGHT HIP (WITH PELVIS IF PERFORMED) 4 VIEWS TECHNIQUE: Fluoroscopic spot image(s) were submitted for interpretation post-operatively. COMPARISON:  12/20/2018. FINDINGS: ORIF right hip.  Hardware intact.  Anatomic alignment. IMPRESSION: ORIF right hip.  Anatomic alignment. Electronically Signed   By: Marcello Moores  Register   On: 12/17/2018 13:23    STUDIES:  See CT stroke studies  CULTURES: Preliminary urine culture with gram-negative rods and a colony count greater than 100,000, final culture and sensitivity pending  ANTIBIOTICS: Ceftriaxone  SIGNIFICANT EVENTS: 01/01/2019: Admitted with mechanical fall and a right hip fracture 01/04/2019: Acute right MCA  stroke status post surgery for right hip repair  LINES/TUBES: Peripheral IVs Foley catheter  DISCUSSION: 85 year old female admitted with a right hip fracture status post mechanical fall at home, now presenting with an acute stroke involving the right MCA territory status post surgical repair of right hip.  Overall prognosis is poor given patient's comorbidities and clot burden within the right MCA and ICA.  No surgical intervention per endovascular surgery, and neurology.  ASSESSMENT  Acute right MCA stroke  Left hemiplegia and hemiparesis Expressive aphasia Right hip fracture status post  surgical repair Mechanical fall with injury Urinary tract infection Atrial fibrillation Seizure disorder Congestive heart failure AAA-3.4 cm Hyperlipidemia  PLAN Hemodynamic monitoring per ICU protocol Allow for permissive hypertension-keep SBP 140 to 160 mmHg Neurochecks every 2 hours Neurology aware and will follow-up Lipid panel, and hemoglobin A1c in the morning Point-of-care glucose testing every 4 hours Supplemental oxygen to maintain SPO2 greater than 90% Palliative care consult for goals of care and possible transition to hospice Social and care management service consults Speech and swallow evaluation PT eval Keep n.p.o. Keep head of bed at 30 degrees Seizure precautions EEG IV hydration with normal saline at 50 cc an hour Antiplatelet per neurology Dilantin and phenobarbital levels Change Dilantin and phenobarbital to IV We will defer initiation of antiplatelet and anticoagulant to neurology Follow-up: Urine culture Continue antibiotics as above  Best Practice: Code Status: DNR/DNI Diet: N.p.o. GI prophylaxis: Protonix VTE prophylaxis: SCDs and resume Eliquis per neurology  FAMILY  - Updates: Met with patient's daughters Ms. June Forbis and Juliann Pulse Cooper(on the phone) to discuss neurology and endovascular surgery recommendations as well as patient's change in condition  with results of imaging studies.  Reviewed the new stroke findings and recommendations for no surgical intervention by endovascular surgery and also contraindications to TPA given patient's recent surgery.  Discussed patient's new diagnosis in light of her current comorbidities, age and overall quality of life.  Daughters indicated that 8 years ago they lost their father to cancer and the whole experience was very traumatic as they were in and out of the hospital for interventions that did not really improve his quality of life.  They do not want such to repeat itself for their mother.  They realize the gravity of her diagnosis and the impact it will have on her overall health status.  At this point they will like her care to be focused on conservative measures that will improve or preserve her quality of life and avoid any extra pain.  They will not like her to have a hemicraniectomy even if she had severe cerebral edema.  They will prefer conservative treatments.  They also want patient to be a DNR/DNI and be kept here at [facility].  I reviewed transitional  care options such as home hospice and hospice house as well as long-term care.  I indicated to them that the social service department as well as the palliative care team will work with them to identified the appropriate transitional course for the patient.  She will be kept in the ICU for the next 48 to 72 hours.  They did indicate that if she deteriorates they will prefer that she be transitioned to comfort care and be allowed to die peacefully.  I offered to call the chaplain to the bedside but the daughter indicated that she was okay.  I encouraged them to call with questions or concerns.  All questions answered and support provided.   S. Genoveva Ill ANP-BC Pulmonary and [place] Pager [phone] or [phone]  NB: This document was prepared using Dragon voice recognition software and may include unintentional  dictation errors.    01/07/2019, 2:11 AM

## 2019-01-07 NOTE — Evaluation (Signed)
Clinical/Bedside Swallow Evaluation Patient Details  Name: Kelly Swanson MRN: [id number] Date of Birth: [birth year]  Today's Date: 01/07/2019 Time: SLP Start Time (ACUTE ONLY): 0910 SLP Stop Time (ACUTE ONLY): 0935 SLP Time Calculation (min) (ACUTE ONLY): 25 min  Past Medical History:  Past Medical History:  Diagnosis Date  . AAA (abdominal aortic aneurysm) (HCC)    3.4cm  . Anemia   . Ankle fracture   . Atrial fibrillation ([place])    on eliquis  . CHF (congestive heart failure) ([place])   . Colon polyps   . DJD (degenerative joint disease)   . Hyperlipidemia   . Leukopenia   . Mitral valve regurgitation    s/p mitral annuloplasty  . Osteoporosis   . Recurrent UTI   . Right eye trauma   . Seizures ([place])    Past Surgical History:  Past Surgical History:  Procedure Laterality Date  . ABDOMINAL HYSTERECTOMY    . CLOSED REDUCTION HUMERAL SHAFT FRACTURE Left   . CLOSED REDUCTION RADIAL SHAFT FRACTURE Right   . ELECTROPHYSIOLOGIC STUDY N/A 03/31/2016   Procedure: CARDIOVERSION;  Surgeon: Isaias Cowman, MD;  Location: [facility] ORS;  Service: Cardiovascular;  Laterality: N/A;  . EYE SURGERY    . FOOT SURGERY Left   . hip nailing Left   . INTRAMEDULLARY (IM) NAIL INTERTROCHANTERIC Right 12/17/2018   Procedure: INTRAMEDULLARY (IM) NAIL INTERTROCHANTRIC;  Surgeon: Leim Fabry, MD;  Location: [facility] ORS;  Service: Orthopedics;  Laterality: Right;  . MITRAL VALVULOPLASTY    . ORIF WRIST FRACTURE Left    HPI:      Assessment / Plan / Recommendation Clinical Impression  pt was referred for a speech evaluation due to a CVA post hip fx. pt RN reports large blood clot on brain that is inoperable. pt presents with lethergy upon st arrival. pt unable to be sat up in bed due to medical conditions. pt is seated at 30 degrees for session. pt was administered trials of thin liquid, ice chips and honey thick liwquid. pt was noted to have poor awareness of bolus with all po trials. pt had  increased mastication and oral holding. During/ post po intake pt was noted to have change in repritory rate from 100 at rest to 94 with bolus trials. During/ post intake pt was also noted to have wet vocal qualtiy and multiple audible swallows. ST does not recommend a diet at this time, but may benefit from MD recommendation for ice chips for pleasure due to poor prognosis. Prognosis is guarded with pt diet upgrade until pt is able to sit at 90 degrees safely. ST to continue to follow. SLP Visit Diagnosis: Dysphagia, oropharyngeal phase (R13.12)    Aspiration Risk  Severe aspiration risk;Risk for inadequate nutrition/hydration    Diet Recommendation NPO;Alternative means - temporary        Other  Recommendations Oral Care Recommendations: Oral care BID   Follow up Recommendations        Frequency and Duration min 3x week  2 weeks       Prognosis Prognosis for Safe Diet Advancement: Guarded Barriers to Reach Goals: Cognitive deficits;Severity of deficits      Swallow Study   General Date of Onset: 12/24/2018 Type of Study: Bedside Swallow Evaluation Diet Prior to this Study: NPO Temperature Spikes Noted: No Respiratory Status: Nasal cannula History of Recent Intubation: No Behavior/Cognition: Cooperative;Confused Oral Cavity Assessment: Dry Oral Care Completed by SLP: Yes Oral Cavity - Dentition: Adequate natural dentition Vision: Impaired for self-feeding Self-Feeding Abilities:  Total assist Patient Positioning: Upright in bed Baseline Vocal Quality: Aphonic Volitional Cough: Weak Volitional Swallow: Unable to elicit    Oral/Motor/Sensory Function     Ice Chips Ice chips: Impaired Oral Phase Impairments: Reduced labial seal;Reduced lingual movement/coordination;Impaired mastication;Poor awareness of bolus Oral Phase Functional Implications: Left anterior spillage;Prolonged oral transit;Oral holding Pharyngeal Phase Impairments: Suspected delayed Swallow;Multiple  swallows;Decreased hyoid-laryngeal movement;Wet Vocal Quality;Change in Vital Signs   Thin Liquid Thin Liquid: Impaired Oral Phase Impairments: Reduced labial seal;Reduced lingual movement/coordination;Impaired mastication;Poor awareness of bolus Oral Phase Functional Implications: Prolonged oral transit;Oral holding;Left anterior spillage Pharyngeal  Phase Impairments: Wet Vocal Quality;Suspected delayed Swallow;Decreased hyoid-laryngeal movement;Multiple swallows    Nectar Thick Nectar Thick Liquid: Not tested   Honey Thick Honey Thick Liquid: Impaired Oral Phase Impairments: Reduced labial seal;Reduced lingual movement/coordination;Impaired mastication;Poor awareness of bolus Oral Phase Functional Implications: Oral holding;Left anterior spillage;Prolonged oral transit;Oral residue Pharyngeal Phase Impairments: Suspected delayed Swallow;Multiple swallows;Change in Vital Signs   Puree Puree: Not tested   Solid     Solid: Not tested      [place] Sauber 01/07/2019,9:37 AM

## 2019-01-07 NOTE — Progress Notes (Signed)
Spoke with June Schwenke Forbis via telephone. She states that she would like to add Huey Bienenstock (Brother) to the visitors list.

## 2019-01-07 NOTE — Progress Notes (Signed)
PT Cancellation Note  Patient Details Name: Kelly Swanson MRN: [id number] DOB: 1932-01-20   Cancelled Treatment:    Reason Eval/Treat Not Completed: Medical issues which prohibited therapy;Patient not medically ready(Code stroke called last night, pt moved to ICU. FAmiyl requesting comfort measures at this time. Will cancel PT order. Please place new PT order should POC change.)    8:36 AM, 01/07/19 Etta Grandchild, PT, DPT Physical Therapist - [facility]  [phone] ([place])    [place] C 01/07/2019, 8:36 AM

## 2019-01-07 NOTE — Progress Notes (Signed)
OT Cancellation Note  Patient Details Name: Kelly Swanson MRN: [id number] DOB: 1932-05-10   Cancelled Treatment:    Reason Eval/Treat Not Completed: Medical issues which prohibited therapy Patient status post Code stroke last night and patient transferred to ICU. Family requesting comfort measures at this time. Will cancel OT order. Please place new OT order should POC change. Thank you for the referral.  Chrys Racer, OTR/L, [facility] ascom [phone] 01/07/19, 11:09 AM

## 2019-01-07 NOTE — Significant Event (Signed)
Rapid Response Event Note  Overview: Time Called: 2030 Arrival Time: 2030 Event Type: Neurologic  Initial Focused Assessment: Last known well was  Care nurse noticed a change around 1955, but called rapid response at 2030 that turned into a Code Stroke. Patient alert and responds but unable to move left side, and has a left facial droop. Able to respond but has some aphasia. Had right hip surgery and was on the orthopedic floor post op.   Interventions:Patient transferred to CT for CT head and neck, then to ICU 16 for teleneuro consult, transferred back to CT for CT perfusion and back to ICU 16 for pending transfer to [place] (if not transferred):  Event Summary: Name of Physician Notified: Hooten at 2030  Name of Consulting Physician Notified: Dr. Mike Craze Neurologist at 2100  Outcome: Stayed in room and stabalized, Transferred (Comment)  Event End Time: (reamined with patient for transfer to [facility])  [place]

## 2019-01-07 NOTE — Progress Notes (Signed)
[place] [facility] [place] NAME: Zenia Guest    MR#:  [id number]  DATE OF BIRTH:  1931-11-03  SUBJECTIVE:  CHIEF COMPLAINT:   Chief Complaint  Patient presents with   Fall   Patient reported to have had a large right-sided hemispheric CVA.  Patient already placed on comfort care measures by nursing staff.  2 daughters present at bedside.  REVIEW OF SYSTEMS:  ROS unobtainable due to medical condition  DRUG ALLERGIES:  No Known Allergies VITALS:  Blood pressure 126/73, pulse 69, temperature 98.2 F (36.8 C), temperature source Axillary, resp. rate 16, height 5\' 2"  (1.575 m), weight 65.3 kg, SpO2 100 %. PHYSICAL EXAMINATION:  Physical Exam   GENERAL:85 y.o.-year-old patient lying in the bed with no acute distress.  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 No scleral icterus. Extraocular muscles intact.  HEENT: Head atraumatic, normocephalic. Oropharynx and nasopharynx clear.  NECK: Supple, no jugular venous distention. No thyroid enlargement, no tenderness.  LUNGS: Normal breath sounds bilaterally, no wheezing, rales,rhonchi or crepitation. No use of accessory muscles of respiration.Decreased bibasilar breath sounds CARDIOVASCULAR: S1, S2 normal. No rubs, or gallops. 2/6 systolic murmur is present ABDOMEN: Soft, nontender, nondistended. Bowel sounds present. No organomegaly or mass.  EXTREMITIES: No cyanosis, or clubbing. Chronic 2+ bilateral pedal edema noted. Right leg is abducted and externally rotated. NEUROLOGIC: Cranial nerves II through XII are intact. Muscle strength 5/5 in all extremitiesexcept right leg due to pain. Sensation intact. Gait not checked.  PSYCHIATRIC: The patient is alert and oriented x 3.  SKIN: No obvious rash, lesion, or ulcer.   LABORATORY PANEL:  Female CBC Recent Labs  Lab 01/07/19 0354  WBC 11.4*  HGB 11.1*  HCT 34.5*  PLT 437*    ------------------------------------------------------------------------------------------------------------------ Chemistries  Recent Labs  Lab 12/14/2018 1130  01/07/19 0354  NA 143   < > 140  K 3.8   < > 4.0  CL 106   < > 106  CO2 28   < > 26  GLUCOSE 142*   < > 155*  BUN 21   < > 17  CREATININE 0.96   < > 0.72  CALCIUM 9.4   < > 8.8*  AST 35  --   --   ALT 26  --   --   ALKPHOS 46  --   --   BILITOT 0.7  --   --    < > = values in this interval not displayed.   RADIOLOGY:  Ct Angio Head W Or Wo Contrast  Result Date: 01/05/2019 CLINICAL DATA:  Code stroke. Altered mental status. Left facial droop. Confusion. EXAM: CT ANGIOGRAPHY HEAD AND NECK TECHNIQUE: Multidetector CT imaging of the head and neck was performed using the standard protocol during bolus administration of intravenous contrast. Multiplanar CT image reconstructions and MIPs were obtained to evaluate the vascular anatomy. Carotid stenosis measurements (when applicable) are obtained utilizing NASCET criteria, using the distal internal carotid diameter as the denominator. CONTRAST:  22mL OMNIPAQUE IOHEXOL 350 MG/ML SOLN COMPARISON:  None. FINDINGS: CT HEAD FINDINGS Brain: Suspicion of loss of gray-white differentiation in the right insula and frontoparietal brain. No swelling or hemorrhage. Mild chronic small-vessel ischemic change elsewhere. Vascular: Suspicion of hyperdense right MCA. Skull: Negative Sinuses: Clear Orbits: Globe implant on the right. Review of the MIP images confirms the above findings CTA NECK FINDINGS Aortic arch: Aortic atherosclerosis and tortuosity. No sign of aneurysm or dissection.  Branching pattern is normal. Right carotid system: Common carotid artery widely patent to the bifurcation. There is motion degradation at the bifurcation level. Occlusion of the right ICA beyond the bulb. This could be due to embolic occlusion or dissection. Left carotid system: Common carotid artery widely patent to the  bifurcation. Carotid bifurcation is normal. No stenosis or irregularity. Cervical ICA is normal. Vertebral arteries: Both vertebral artery origins are widely patent. Both vertebral arteries are patent through the cervical region to the foramen magnum. Skeleton: Ordinary cervical spondylosis. Other neck: No mass or adenopathy. Upper chest: No acute chest disease. Review of the MIP images confirms the above findings CTA HEAD FINDINGS Anterior circulation: Left internal carotid artery is patent through the skull base and siphon region. There is good supply of the left MCA and ACA territories. Patent anterior communicating artery allows supply to the right anterior cerebral artery. Right MCA is completely occluded. Posterior circulation: Both vertebral arteries widely patent to the basilar. No basilar stenosis. Posterior circulation branch vessels are normal. No patent posterior communicating artery seen. Venous sinuses: Patent and normal. Anatomic variants: None significant. Review of the MIP images confirms the above findings IMPRESSION: Complete occlusion of the right ICA. This could be due to a large embolus or dissection. Complete occlusion of the right middle cerebral artery. Right ACA receives it supply from the patent anterior communicating artery and left carotid system. Left carotid bifurcation is normal. Left intracranial anterior circulation is normal. No posterior circulation pathology seen. Early loss of gray-white differentiation in the right insular region. Aspects would be 7 or 8. These results were called by telephone at the time of interpretation on 12/13/2018 at 9:07 pm to Dr. Skip Estimable , who verbally acknowledged these results. Electronically Signed   By: Nelson Chimes M.D.   On: 12/16/2018 21:10   Ct Angio Neck W Or Wo Contrast  Result Date: 01/03/2019 CLINICAL DATA:  Code stroke. Altered mental status. Left facial droop. Confusion. EXAM: CT ANGIOGRAPHY HEAD AND NECK TECHNIQUE: Multidetector  CT imaging of the head and neck was performed using the standard protocol during bolus administration of intravenous contrast. Multiplanar CT image reconstructions and MIPs were obtained to evaluate the vascular anatomy. Carotid stenosis measurements (when applicable) are obtained utilizing NASCET criteria, using the distal internal carotid diameter as the denominator. CONTRAST:  84mL OMNIPAQUE IOHEXOL 350 MG/ML SOLN COMPARISON:  None. FINDINGS: CT HEAD FINDINGS Brain: Suspicion of loss of gray-white differentiation in the right insula and frontoparietal brain. No swelling or hemorrhage. Mild chronic small-vessel ischemic change elsewhere. Vascular: Suspicion of hyperdense right MCA. Skull: Negative Sinuses: Clear Orbits: Globe implant on the right. Review of the MIP images confirms the above findings CTA NECK FINDINGS Aortic arch: Aortic atherosclerosis and tortuosity. No sign of aneurysm or dissection. Branching pattern is normal. Right carotid system: Common carotid artery widely patent to the bifurcation. There is motion degradation at the bifurcation level. Occlusion of the right ICA beyond the bulb. This could be due to embolic occlusion or dissection. Left carotid system: Common carotid artery widely patent to the bifurcation. Carotid bifurcation is normal. No stenosis or irregularity. Cervical ICA is normal. Vertebral arteries: Both vertebral artery origins are widely patent. Both vertebral arteries are patent through the cervical region to the foramen magnum. Skeleton: Ordinary cervical spondylosis. Other neck: No mass or adenopathy. Upper chest: No acute chest disease. Review of the MIP images confirms the above findings CTA HEAD FINDINGS Anterior circulation: Left internal carotid artery is patent through the skull  base and siphon region. There is good supply of the left MCA and ACA territories. Patent anterior communicating artery allows supply to the right anterior cerebral artery. Right MCA is  completely occluded. Posterior circulation: Both vertebral arteries widely patent to the basilar. No basilar stenosis. Posterior circulation branch vessels are normal. No patent posterior communicating artery seen. Venous sinuses: Patent and normal. Anatomic variants: None significant. Review of the MIP images confirms the above findings IMPRESSION: Complete occlusion of the right ICA. This could be due to a large embolus or dissection. Complete occlusion of the right middle cerebral artery. Right ACA receives it supply from the patent anterior communicating artery and left carotid system. Left carotid bifurcation is normal. Left intracranial anterior circulation is normal. No posterior circulation pathology seen. Early loss of gray-white differentiation in the right insular region. Aspects would be 7 or 8. These results were called by telephone at the time of interpretation on 12/15/2018 at 9:07 pm to Dr. Skip Estimable , who verbally acknowledged these results. Electronically Signed   By: Nelson Chimes M.D.   On: 01/05/2019 21:10   Ct Cerebral Perfusion W Contrast  Result Date: 01/05/2019 CLINICAL DATA:  Right hemisphere stroke EXAM: CT PERFUSION BRAIN TECHNIQUE: Multiphase CT imaging of the brain was performed following IV bolus contrast injection. Subsequent parametric perfusion maps were calculated using RAPID software. CONTRAST:  67mL OMNIPAQUE IOHEXOL 350 MG/ML SOLN COMPARISON:  Earlier same day FINDINGS: CT Brain Perfusion Findings: CBF (<30%) Volume: 52mL Perfusion (Tmax>6.0s) volume: 253mL Mismatch Volume: 149mL ASPECTS on noncontrast CT Head: 7 or 8 at 2040 hours today. Infarct Core: 87 mL Infarction Location:Right insula, lateral temporal lobe, temporoparietal junction and frontal operculum. IMPRESSION: Consistent with embolic infarction in the right MCA territory with core infarct of 87 cc including involvement of the insula, lateral temporal lobe, temporoparietal junction and frontal operculum.  Considerable penumbra elsewhere throughout the entire right middle cerebral artery territory, consistent with the CTA findings. These results were called by telephone at the time of interpretation on 12/07/2018 at 10:11 pm to Dr. Faustino Congress , who verbally acknowledged these results. Electronically Signed   By: Nelson Chimes M.D.   On: 01/05/2019 22:12   ASSESSMENT AND PLAN:   FrancesWoodyis a87 y.o.femalewith a known history of atrial fibrillation status post prior ablation and cardioversion, on Eliquis, status post pacemaker, chronic heart failure, mitral valve disease status post annuloplasty, seizure disorder, anemia of chronic disease and AAA of 3.4 cm presents to hospital secondary to mechanical fall at home and right hip fracture.  1. Comminuted right intertrochanteric fracture- Patient seen by orthopedic surgeon.  Day 1 status post surgery. Patient said to have subsequently developed acute CVA. Already placed on comfort care measures per nursing staff at bedside. 2 daughters present at bedside.  2. Acute cystitis- Now on comfort care measures only.    3. Atrial fibrillation-rate controlled.  Patient now on comfort care measures only.  Anticoagulation previously placed on hold due to recent surgery  4. Seizure disorder-no recent history of seizures. Continue Dilantin and phenobarbital.  5. Acute CVA in the right ICA MCA territory. Now on comfort care measures   All the records are reviewed and case discussed with Care Management/Social Worker. Management plans discussed with the patient, family and they are in agreement. Updated 2 daughters present at bedside. CODE STATUS: DNR  TOTAL TIME TAKING CARE OF THIS PATIENT: 35 minutes.   More than 50% of the time was spent in counseling/coordination of care: YES  POSSIBLE D/C  IN 1-2 DAYS, DEPENDING ON CLINICAL CONDITION.   Izea Livolsi M.D on 01/07/2019 at 3:09 PM  Between 7am to 6pm - Pager - [phone]  After  6pm go to [email or web address] - password [email or web address] [facility]  [facility]  Office  [phone]  CC: Primary care physician; Adin Hector, MD  Note: This dictation was prepared with Dragon dictation along with smaller phrase technology. Any transcriptional errors that result from this process are unintentional.

## 2019-01-07 NOTE — Progress Notes (Signed)
The patient is POD1 a intramedullary nailing of the right femur.  Unfortunately the patient suffered a Right hemispheric infarct, MCA distribution infarct and right ICA occlusion.  Family decided to not pursue any aggressive treatment focusing on pain control and comfort measures.  Patient is not responsive this morning, dressing to the right hip is clean without any significant drainage.  Will continue to follow the patient's wound and perform dressing changes as needed at this time.  Raquel James, PA-C [place]

## 2019-01-07 NOTE — Progress Notes (Signed)
Spoke with Levada Dy about holding po meds and if she wanted her to start on the NS at 47 since it was not hanging and the patient has CHF. She said to change rate to 50, but once transfer to ICU status Magdalene NP said it is okay to hold fluids at this time.Dr. Emmit Alexanders spoke with NP Gardiner Barefoot and requested for patient to be intubated for airway protection based on GCS. Family (daughter June Forbis)  was called for consent. NP Harless Nakayama had patient's family called to come to speak with them at the bedside.  NP presently speaking with patient daughter June in the waiting room, and her other daughter via telephone about patient's prognosis. Will continue to monitor.

## 2019-01-07 DEATH — deceased

## 2019-01-08 MED ORDER — ORAL CARE MOUTH RINSE
15.0000 mL | Freq: Two times a day (BID) | OROMUCOSAL | Status: DC
  Administered 2019-01-08 – 2019-01-11 (×8): 15 mL via OROMUCOSAL

## 2019-01-08 MED ORDER — CHLORHEXIDINE GLUCONATE 0.12 % MT SOLN
15.0000 mL | Freq: Two times a day (BID) | OROMUCOSAL | Status: DC
  Administered 2019-01-08 – 2019-01-11 (×7): 15 mL via OROMUCOSAL
  Filled 2019-01-08 (×6): qty 15

## 2019-01-08 NOTE — Progress Notes (Signed)
[place] [facility] [place] NAME: Kelly Swanson    MR#:  [id number]  DATE OF BIRTH:  [birth year]  SUBJECTIVE:  CHIEF COMPLAINT:   Chief Complaint  Patient presents with  . Fall   Patient reported to have had a large right-sided hemispheric CVA following recent right hip surgery..  Patient already placed on comfort care measures and currently on morphine drip.  Resting comfortably this morning.   REVIEW OF SYSTEMS:  ROS unobtainable due to medical condition  DRUG ALLERGIES:  No Known Allergies VITALS:  Blood pressure 103/63, pulse 69, temperature 98.8 F (37.1 C), temperature source Axillary, resp. rate (!) 9, height 5\' 2"  (1.575 m), weight 65.3 kg, SpO2 100 %. PHYSICAL EXAMINATION:  Physical Exam   GENERAL:85 y.o.-year-old patient lying in the bed with no acute distress.  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 No scleral icterus. Extraocular muscles intact.  HEENT: Head atraumatic, normocephalic. Oropharynx and nasopharynx clear.  NECK: Supple, no jugular venous distention. No thyroid enlargement, no tenderness.  LUNGS: Normal breath sounds bilaterally, no wheezing, rales,rhonchi or crepitation. No use of accessory muscles of respiration.Decreased bibasilar breath sounds CARDIOVASCULAR: S1, S2 normal. No rubs, or gallops. 2/6 systolic murmur is present ABDOMEN: Soft, nontender, nondistended. Bowel sounds present. No organomegaly or mass.  EXTREMITIES: No cyanosis, or clubbing. Chronic 2+ bilateral pedal edema noted. Right leg is abducted and externally rotated. NEUROLOGIC: Patient sedated and resting comfortably.  Currently on comfort care measures. PSYCHIATRIC: Resting comfortably. SKIN: No obvious rash, lesion, or ulcer.   LABORATORY PANEL:  Female CBC Recent Labs  Lab 01/07/19 0354  WBC 11.4*  HGB 11.1*  HCT 34.5*  PLT 437*    ------------------------------------------------------------------------------------------------------------------ Chemistries  Recent Labs  Lab 12/16/2018 1130  01/07/19 0354  NA 143   < > 140  K 3.8   < > 4.0  CL 106   < > 106  CO2 28   < > 26  GLUCOSE 142*   < > 155*  BUN 21   < > 17  CREATININE 0.96   < > 0.72  CALCIUM 9.4   < > 8.8*  AST 35  --   --   ALT 26  --   --   ALKPHOS 46  --   --   BILITOT 0.7  --   --    < > = values in this interval not displayed.   RADIOLOGY:  No results found. ASSESSMENT AND PLAN:   Kelly Swanson a85 y.o.femalewith a known history of atrial fibrillation status post prior ablation and cardioversion, on Eliquis, status post pacemaker, chronic heart failure, mitral valve disease status post annuloplasty, seizure disorder, anemia of chronic disease and AAA of 3.4 cm presents to hospital secondary to mechanical fall at home and right hip fracture.  1. Comminuted right intertrochanteric fracture- Patient seen by orthopedic surgeon.  Day 2 status post surgery. Patient said to have subsequently developed acute CVA. Already placed on comfort care measures and subsequently transferred out of ICU.  I updated patient's 2 daughters present at bedside yesterday and he agreed with plans.  Patient currently on morphine drip.  We will discuss getting hospice on board in a.m. if patient still here  2. Acute cystitis- Now on comfort care measures only.    3. Atrial fibrillation-rate controlled.  Patient now on comfort care measures only.  Anticoagulation previously placed on hold due to recent surgery  4. Seizure disorder-no recent  history of seizures. Continue Dilantin and phenobarbital.  5. Acute CVA in the right ICA MCA territory. Now on comfort care measures   All the records are reviewed and case discussed with Care Management/Social Worker. Management plans discussed with the patient, family and they are in agreement. Updated 2  daughters present at bedside yesterday.  They agree with plan of care as outlined.Marland Kitchen CODE STATUS: DNR  TOTAL TIME TAKING CARE OF THIS PATIENT: 28 minutes.   More than 50% of the time was spent in counseling/coordination of care: YES  POSSIBLE D/C IN 1-2 DAYS, DEPENDING ON CLINICAL CONDITION.   Kelly Swanson M.D on 01/08/2019 at 12:44 PM  Between 7am to 6pm - Pager - [phone]  After 6pm go to [email or web address] - password [email or web address] [facility]  [employer] [facility]  Office  [phone]  CC: Primary care physician; Adin Hector, MD  Note: This dictation was prepared with Dragon dictation along with smaller phrase technology. Any transcriptional errors that result from this process are unintentional.

## 2019-01-08 NOTE — Progress Notes (Signed)
Updated Kelly Swanson that patient has been transferred to bed 126.

## 2019-01-09 DIAGNOSIS — I63411 Cerebral infarction due to embolism of right middle cerebral artery: Secondary | ICD-10-CM

## 2019-01-09 DIAGNOSIS — Z515 Encounter for palliative care: Secondary | ICD-10-CM

## 2019-01-09 MED ORDER — GLYCOPYRROLATE 0.2 MG/ML IJ SOLN
0.4000 mg | INTRAMUSCULAR | Status: DC | PRN
  Filled 2019-01-09: qty 2

## 2019-01-09 MED ORDER — LORAZEPAM 2 MG/ML IJ SOLN: 1.0000 mg | INTRAMUSCULAR | Status: DC | PRN

## 2019-01-09 MED ORDER — MORPHINE BOLUS VIA INFUSION
2.0000 mg | INTRAVENOUS | Status: DC | PRN
  Administered 2019-01-11: 2 mg via INTRAVENOUS
  Filled 2019-01-09: qty 2

## 2019-01-09 NOTE — Plan of Care (Signed)

## 2019-01-09 NOTE — Care Management Important Message (Signed)
Important Message  Patient Details  Name: Kelly Swanson MRN: [id number] Date of Birth: 1931/08/01   Medicare Important Message Given:  Yes     Juliann Pulse A [place] 01/09/2019, 11:13 AM

## 2019-01-09 NOTE — Progress Notes (Signed)
[place] [facility] [place] NAME: Kelly Swanson    MR#:  [id number]  DATE OF BIRTH:  [birth year]  SUBJECTIVE:  CHIEF COMPLAINT:   Chief Complaint  Patient presents with  . Fall   Patient reported to have had a large right-sided hemispheric CVA following recent right hip surgery..  Patient already placed on comfort care measures and currently on morphine drip.  Resting comfortably this morning.  I have updated 2 daughters present at bedside and all questions answered.  REVIEW OF SYSTEMS:  ROS unobtainable due to medical condition  DRUG ALLERGIES:  No Known Allergies VITALS:  Blood pressure (!) 99/57, pulse 84, temperature 99.1 F (37.3 C), temperature source Axillary, resp. rate (!) 9, height 5\' 2"  (1.575 m), weight 65.3 kg, SpO2 100 %. PHYSICAL EXAMINATION:  Physical Exam   GENERAL:85 y.o.-year-old patient lying in the bed with no acute distress.  EYES:Left pupilequal, round, reactive to light and accommodation.No scleral icterus. Extraocular muscles intact.  HEENT: Head atraumatic, normocephalic. Oropharynx and nasopharynx clear.  NECK: Supple, no jugular venous distention. No thyroid enlargement, no tenderness.  LUNGS: Normal breath sounds bilaterally, no wheezing, rales,rhonchi or crepitation. No use of accessory muscles of respiration.Decreased bibasilar breath sounds CARDIOVASCULAR: S1, S2 normal. No rubs, or gallops. 2/6 systolic murmur is present ABDOMEN: Soft, nontender, nondistended. Bowel sounds present. No organomegaly or mass.  EXTREMITIES: No cyanosis, or clubbing. Chronic 2+ bilateral pedal edema noted. Right leg is abducted and externally rotated. NEUROLOGIC: Patient sedated and resting comfortably.  Currently on comfort care measures. PSYCHIATRIC: Resting comfortably. SKIN: No obvious rash, lesion, or ulcer.   LABORATORY PANEL:  Female CBC Recent Labs  Lab 01/07/19 0354  WBC 11.4*  HGB 11.1*  HCT 34.5*   PLT 437*   ------------------------------------------------------------------------------------------------------------------ Chemistries  Recent Labs  Lab 12/23/2018 1130  01/07/19 0354  NA 143   < > 140  K 3.8   < > 4.0  CL 106   < > 106  CO2 28   < > 26  GLUCOSE 142*   < > 155*  BUN 21   < > 17  CREATININE 0.96   < > 0.72  CALCIUM 9.4   < > 8.8*  AST 35  --   --   ALT 26  --   --   ALKPHOS 46  --   --   BILITOT 0.7  --   --    < > = values in this interval not displayed.   RADIOLOGY:  No results found. ASSESSMENT AND PLAN:   Viveiros a85 y.o.femalewith a known history of atrial fibrillation status post prior ablation and cardioversion, on Eliquis, status post pacemaker, chronic heart failure, mitral valve disease status post annuloplasty, seizure disorder, anemia of chronic disease and AAA of 3.4 cm presents to hospital secondary to mechanical fall at home and right hip fracture.  1. Comminuted right intertrochanteric fracture- Patient seen by orthopedic surgeon.  Day 3 status post surgery. Patient said to have subsequently developed acute CVA. Already placed on comfort care measures and subsequently transferred out of ICU.  I updated patient's 2 daughters present at bedside this morning.  They agree with plan of care and keeping patient comfortable.  Requested for palliative consult and I discussed the option of discharge with hospice with daughters. Patient appears to be terminal continued decreasing respiratory rate.  Patient may be unsafe for transportation at this time.  Family request to continue with comfort care in the hospital  with morphine drip at this time.   2. Acute cystitis- Now on comfort care measures only.    3. Atrial fibrillation-rate controlled.  Patient now on comfort care measures only.  Anticoagulation previously placed on hold due to recent surgery  4. Seizure disorder-no recent history of seizures. Continue Dilantin and  phenobarbital.  5. Acute CVA in the right ICA MCA territory. Now on comfort care measures  All the records are reviewed and case discussed with Care Management/Social Worker. Management plans discussed with the patient, family and they are in agreement. Updated 2 daughters present at bedside this morning.  They agree with plan of care as outlined.  CODE STATUS: DNR  TOTAL TIME TAKING CARE OF THIS PATIENT: 26 minutes.   More than 50% of the time was spent in counseling/coordination of care: YES  POSSIBLE D/C IN 1-2 DAYS, DEPENDING ON CLINICAL CONDITION.   Kelly Swanson M.D on 01/09/2019 at 11:54 AM  Between 7am to 6pm - Pager - [phone]  After 6pm go to [email or web address] - password [email or web address] [facility]  [employer] [place] [facility]  Office  [phone]  CC: Primary care physician; Adin Hector, MD  Note: This dictation was prepared with Dragon dictation along with smaller phrase technology. Any transcriptional errors that result from this process are unintentional.

## 2019-01-09 NOTE — Consult Note (Signed)
Consultation Note Date: 01/09/2019   Patient Name: Kelly Swanson  DOB: [birth year]  MRN: [id number]  Age / Sex: 85 y.o., female  PCP: Adin Hector, MD Referring Physician: Otila Back, MD  Reason for Consultation: Establishing goals of care  HPI/Patient Profile: 85 y.o. female  with past medical history of atrial fibrillation (s/p ablation and cardioversion) on Eliquis, s/p pacemaker, CHF, mitral valve disease s/p annuloplasty, AAA 3.4 cm, seizure disorder, anemia of chronic disease admitted on 01/01/2019 with fall at home and right hip fracture with surgical repair 7/31. Unfortunately Kelly Swanson has suffered a large right-sided CVA following surgery and prognosis is poor. Subsequently family have decided to proceed with comfort measures.   Clinical Assessment and Goals of Care: I met today at Kelly Swanson's bedside. Kelly Swanson is unresponsive. She is warm to touch. Her 2 daughters are at bedside. We reviewed Kelly Swanson's illness and wishes. Daughters are saddened by the suddenness of Kelly Swanson's stroke and decline but also thankful that she is comfortable at this time. They speak of watching their father suffer with feeding tube and having a prolonged end of life. Kelly Swanson had verbalized to them that this is not what she would have wanted. They have peace knowing that they have made the right decision for comfort.   We discussed apnea and signs of end of life. She is having some significant apnea while I am present in room. For this reason I have not introduced the idea of hospice with family at this time. They are appreciative of the care their mother has received and the honesty from the providers caring for her that made their decision easier. All questions/concerns addressed. Emotional support provided.   At this time I anticipate hospital death given changes in respiratory pattern.    Primary Decision Maker  NEXT OF KIN 2 adult daughters (at bedside)    SUMMARY OF RECOMMENDATIONS   - Comfort care - Anticipate hospital death  Code Status/Advance Care Planning:  DNR   Symptom Management:   Pain/SOB: Morphine infusion; I added bolus via infusion for immediate relief as needed.   Continue seizure meds per IV as ordered: Dilantin and Phenobarbital. Ativan prn anxiety/seizure.   Robinul prn secretions.   Palliative Prophylaxis:   Frequent Pain Assessment, Oral Care and Turn Reposition  Additional Recommendations (Limitations, Scope, Preferences):  Full Comfort Care  Psycho-social/Spiritual:   Desire for further Chaplaincy support:yes  Additional Recommendations: Grief/Bereavement Support  Prognosis:   Hours - Days  Discharge Planning: Anticipated Hospital Death      Primary Diagnoses: Present on Admission: . Hip fracture ([place])   I have reviewed the medical record, interviewed the patient and family, and examined the patient. The following aspects are pertinent.  Past Medical History:  Diagnosis Date  . AAA (abdominal aortic aneurysm) ([facility])    3.4cm  . Anemia   . Ankle fracture   . Atrial fibrillation ([place])    on eliquis  . CHF (congestive heart failure) ([place])   . Colon polyps   .  DJD (degenerative joint disease)   . Hyperlipidemia   . Leukopenia   . Mitral valve regurgitation    s/p mitral annuloplasty  . Osteoporosis   . Recurrent UTI   . Right eye trauma   . Seizures ([place])    Social History   Socioeconomic History  . Marital status: Widowed    Spouse name: Not on file  . Number of children: Not on file  . Years of education: Not on file  . Highest education level: Not on file  Occupational History  . Not on file  Social Needs  . Financial resource strain: Not on file  . Food insecurity    Worry: Not on file    Inability: Not on file  . Transportation needs    Medical: Not on file    Non-medical: Not on file  Tobacco Use  . Smoking  status: Former Smoker    Packs/day: 0.25    Years: 30.00    Pack years: 7.50    Types: Cigarettes  . Smokeless tobacco: Never Used  Substance and Sexual Activity  . Alcohol use: Yes    Comment: rare  . Drug use: No  . Sexual activity: Not on file  Lifestyle  . Physical activity    Days per week: Not on file    Minutes per session: Not on file  . Stress: Not on file  Relationships  . Social Herbalist on phone: Not on file    Gets together: Not on file    Attends religious service: Not on file    Active member of club or organization: Not on file    Attends meetings of clubs or organizations: Not on file    Relationship status: Not on file  Other Topics Concern  . Not on file  Social History Narrative   Lives at home, independent at baseline   Family History  Problem Relation Age of Onset  . Breast cancer Sister   . Liver cancer Brother    Scheduled Meds: . chlorhexidine  15 mL Mouth Rinse BID  . Chlorhexidine Gluconate Cloth  6 each Topical Daily  . mouth rinse  15 mL Mouth Rinse q12n4p  . PHENObarbital  65 mg Intravenous QHS  . phenytoin (DILANTIN) IV  100 mg Intravenous Q8H   Continuous Infusions: . morphine 5 mg/hr (01/09/19 0618)   PRN Meds:.fentaNYL (SUBLIMAZE) injection, HYDROmorphone (DILAUDID) injection, [DISCONTINUED] metoCLOPramide **OR** metoCLOPramide (REGLAN) injection, [DISCONTINUED] ondansetron **OR** ondansetron (ZOFRAN) IV No Known Allergies Review of Systems  Unable to perform ROS: Acuity of condition    Physical Exam Vitals signs and nursing note reviewed.  Constitutional:      General: She is not in acute distress.    Appearance: She is ill-appearing.     Comments: Pallor, frail, elderly  Cardiovascular:     Rate and Rhythm: Normal rate.  Pulmonary:     Effort: No tachypnea, accessory muscle usage or respiratory distress.     Comments: Slowing respirations with periods of apnea Abdominal:     Palpations: Abdomen is soft.   Neurological:     Mental Status: She is unresponsive.     Vital Signs: BP (!) 99/57 (BP Location: Left Arm)   Pulse 84   Temp 99.1 F (37.3 C) (Axillary)   Resp (!) 9   Ht _0  (1.575 m)   Wt 65.3 kg   SpO2 100%   BMI 26.33 kg/m  Pain Scale: Faces POSS *See Group Information*: S-Acceptable,Sleep, easy to  arouse Pain Score: 0-No pain   SpO2: SpO2: 100 % O2 Device:SpO2: 100 % O2 Flow Rate: .O2 Flow Rate (L/min): 2 L/min  IO: Intake/output summary:   Intake/Output Summary (Last 24 hours) at 01/09/2019 0289 Last data filed at 01/09/2019 0300 Gross per 24 hour  Intake 129.71 ml  Output -  Net 129.71 ml    LBM: Last BM Date: 12/14/2018 Baseline Weight: Weight: 65.3 kg Most recent weight: Weight: 65.3 kg     Palliative Assessment/Data:     Time In: 1000 Time Out: 1035 Time Total: 35 min Greater than 50%  of this time was spent counseling and coordinating care related to the above assessment and plan.  Signed by: Vinie Sill, NP Palliative Medicine Team Pager # [phone] (M-F 8a-5p) Team Phone # [phone] (Nights/Weekends)

## 2019-01-09 NOTE — Progress Notes (Addendum)
The patient is POD3 a intramedullary nailing of the right femur.  Unfortunately the patient suffered a Right hemispheric infarct, MCA distribution infarct and right ICA occlusion following surgery.  Family decided to not pursue any aggressive treatment focusing on pain control and comfort measures.  Patient is not responsive this morning,  She has been placed on a morphine drip.  Dressing to the right hip is clean without any significant drainage.  Will continue to follow the patient's wound and perform dressing changes as needed at this time.  Dr. Leim Fabry will follow-up this afternoon and see the patient's family.  Raquel James, PA-C [place]: Patient reevaluated today is have not seen the patient since postoperatively on Friday, 12/12/2018.  Since that time the patient suffered a significant CVA with significant deficits.  She was deemed not a candidate for further invasive treatment at this time.  Due to her worsening condition, the family agreed to proceed with comfort care measures.  She has been placed on a morphine drip.  I had a lengthy discussion with the patient's 2 daughters at the bedside.  We were in agreement to continue this plan of care.  Family has been appreciative of the care that the patient has received so far.

## 2019-01-10 LAB — PHENYTOIN LEVEL, FREE AND TOTAL
Phenytoin, Free: NOT DETECTED ug/mL (ref 1.0–2.0)
Phenytoin, Total: 4.3 ug/mL — ABNORMAL LOW (ref 10.0–20.0)

## 2019-01-10 MED ORDER — KETOROLAC TROMETHAMINE 30 MG/ML IJ SOLN
15.0000 mg | Freq: Four times a day (QID) | INTRAMUSCULAR | Status: DC | PRN
  Administered 2019-01-11: 06:00:00 15 mg via INTRAVENOUS
  Filled 2019-01-10: qty 1

## 2019-01-10 NOTE — Progress Notes (Signed)
Kelly Swanson at [place] NAME: Kelly Swanson    MR#:  [id number]  DATE OF BIRTH:  04-19-1932  SUBJECTIVE:  CHIEF COMPLAINT:   Chief Complaint  Patient presents with  . Fall   Patient reported to have had a large right-sided hemispheric CVA following recent right hip surgery..  Patient already placed on comfort care measures and currently on morphine drip.  Resting comfortably   REVIEW OF SYSTEMS:  ROS unobtainable due to medical condition  DRUG ALLERGIES:  No Known Allergies VITALS:  Blood pressure (!) 98/57, pulse 84, temperature (!) 100.4 F (38 C), temperature source Axillary, resp. rate (!) 6, height 5\' 2"  (1.575 m), weight 65.3 kg, SpO2 97 %. PHYSICAL EXAMINATION:  Physical Exam   GENERAL:85 y.o.-year-old patient lying in the bed with no acute distress.  EYES:Left pupilequal, round, reactive to light and accommodation.No scleral icterus. Extraocular muscles intact.  HEENT: Head atraumatic, normocephalic. Oropharynx and nasopharynx clear.  NECK: Supple, no jugular venous distention. No thyroid enlargement, no tenderness.  LUNGS: Normal breath sounds bilaterally, no wheezing, rales,rhonchi or crepitation. No use of accessory muscles of respiration.Decreased bibasilar breath sounds CARDIOVASCULAR: S1, S2 normal. No rubs, or gallops. 2/6 systolic murmur is present ABDOMEN: Soft, nontender, nondistended. Bowel sounds present. No organomegaly or mass.  EXTREMITIES: No cyanosis, or clubbing. Chronic 2+ bilateral pedal edema noted. Right leg is abducted and externally rotated. NEUROLOGIC: Patient sedated and resting comfortably.  Currently on comfort care measures. PSYCHIATRIC: Resting comfortably. SKIN: No obvious rash, lesion, or ulcer.   LABORATORY PANEL:  Female CBC Recent Labs  Lab 01/07/19 0354  WBC 11.4*  HGB 11.1*  HCT 34.5*  PLT 437*    ------------------------------------------------------------------------------------------------------------------ Chemistries  Recent Labs  Lab 12/20/2018 1130  01/07/19 0354  NA 143   < > 140  K 3.8   < > 4.0  CL 106   < > 106  CO2 28   < > 26  GLUCOSE 142*   < > 155*  BUN 21   < > 17  CREATININE 0.96   < > 0.72  CALCIUM 9.4   < > 8.8*  AST 35  --   --   ALT 26  --   --   ALKPHOS 46  --   --   BILITOT 0.7  --   --    < > = values in this interval not displayed.   RADIOLOGY:  No results found. ASSESSMENT AND PLAN:   Kelly Swanson a known history of atrial fibrillation status post prior ablation and cardioversion, on Eliquis, status post pacemaker, chronic heart failure, mitral valve disease status post annuloplasty, seizure disorder, anemia of chronic disease and AAA of 3.4 cm presents to hospital secondary to mechanical fall at home and right hip fracture.  1. Comminuted right intertrochanteric fracture- Patient seen by orthopedic surgeon.  Day 3 status post surgery. Patient said to have subsequently developed acute CVA. Already placed on comfort care measures and subsequently transferred out of ICU.  I updated patient's 2 daughters present at bedside this morning.  They agree with plan of care and keeping patient comfortable.  Requested for palliative consult and I discussed the option of discharge with hospice with daughters. Patient appears to be terminal continued decreasing respiratory rate.  Patient  unsafe for transportation at this time.  Family request to continue with comfort care in the hospital with morphine drip at this time.  Palliative care following patient.  Anticipate hospital  death  2. Acute cystitis- Now on comfort care measures only.    3. Atrial fibrillation-rate controlled.  Patient now on comfort care measures only.  Anticoagulation previously placed on hold due to recent surgery  4. Seizure disorder-no recent history of  seizures. Continue Dilantin and phenobarbital.  5. Acute CVA in the right ICA MCA territory. Now on comfort care measures  All the records are reviewed and case discussed with Care Management/Social Worker. Management plans discussed with the patient, family and they are in agreement. Updated 2 daughters present at bedside recently.  They agree with plan of care as outlined.  CODE STATUS: DNR  TOTAL TIME TAKING CARE OF THIS PATIENT: 25 minutes.   More than 50% of the time was spent in counseling/coordination of care: YES  POSSIBLE D/C IN 1-2 DAYS, DEPENDING ON CLINICAL CONDITION.   Kelly Swanson M.D on 01/10/2019 at 1:05 PM  Between 7am to 6pm - Pager - [phone]  After 6pm go to [email or web address] - password [email or web address] [facility]  [employer] [place] [facility]  Office  [phone]  CC: Primary care physician; Adin Hector, MD  Note: This dictation was prepared with Dragon dictation along with smaller phrase technology. Any transcriptional errors that result from this process are unintentional.

## 2019-01-10 NOTE — Progress Notes (Signed)
Palliative:  HPI: 85 y.o. female  with past medical history of atrial fibrillation (s/p ablation and cardioversion) on Eliquis, s/p pacemaker, CHF, mitral valve disease s/p annuloplasty, AAA 3.4 cm, seizure disorder, anemia of chronic disease admitted on 12/13/2018 with fall at home and right hip fracture with surgical repair 7/31. Unfortunately Ms. Mclaine has suffered a large right-sided CVA following surgery and prognosis is poor. Subsequently family have decided to proceed with comfort measures.    I met again today at Ms. Byus's bedside. She continues to appear comfortable on morphine infusion. Continues with apnea episodes and Cheynes-Stokes breathing. Daughters at bedside and comfortable with plan of care. Hopeful for hospital death. We discussed d/c nasal cannula and oxygen to allow more natural death - daughters agree. They are very at peace with their mother's prognosis and end of life plans. They are again appreciative of the care and guidance they have received for their mother.   Exam: Unresponsive. No distress. Breathing irregular, apnea, Cheynes-Stokes. Abd soft. Extremities warm. Slight fever.   Plan: - Anticipate hospital death.  - Added IV toradol PRN fever.  - Oxygen d/c. Do not add oxygen. Full comfort care  15 min  Vinie Sill, NP Palliative Medicine Team Pager [phone] (Please see [email or web address] for schedule) Team Phone [phone]    Greater than 50%  of this time was spent counseling and coordinating care related to the above assessment and plan

## 2019-01-11 MED ORDER — ACETAMINOPHEN 650 MG RE SUPP
650.0000 mg | Freq: Once | RECTAL | Status: AC
  Administered 2019-01-11: 650 mg via RECTAL
  Filled 2019-01-11: qty 1

## 2019-02-07 NOTE — Discharge Summary (Signed)
[place] at [place] NAME: Kelly Swanson    MR#:  [id number]  DATE OF BIRTH:  1931-08-17  DATE OF ADMISSION:  12/19/2018   ADMITTING PHYSICIAN: Gladstone Lighter, MD  DATE OF DISCHARGE: 01/21/19 10:45 PM  PRIMARY CARE PHYSICIAN: Adin Hector, MD   ADMISSION DIAGNOSIS:  Fall, initial encounter [phone]] Closed right hip fracture, initial encounter ([place]) [S72.001A] DISCHARGE DIAGNOSIS:  Active Problems:   Hip fracture (HCC)   Cerebrovascular accident (CVA) due to embolism of right middle cerebral artery [facility])   Terminal care   Palliative care encounter  SECONDARY DIAGNOSIS:   Past Medical History:  Diagnosis Date  . AAA (abdominal aortic aneurysm) (HCC)    3.4cm  . Anemia   . Ankle fracture   . Atrial fibrillation ([place])    on eliquis  . CHF (congestive heart failure) ([place])   . Colon polyps   . DJD (degenerative joint disease)   . Hyperlipidemia   . Leukopenia   . Mitral valve regurgitation    s/p mitral annuloplasty  . Osteoporosis   . Recurrent UTI   . Right eye trauma   . Seizures [facility])    HOSPITAL COURSE:   This is a death summary;  Chief complaint; fall  History of presenting complaint; Kelly Swanson  is a 85 y.o. female with a known history of atrial fibrillation status post prior ablation and cardioversion, on Eliquis, status post pacemaker, chronic heart failure, mitral valve disease status post annuloplasty, seizure disorder, anemia of chronic disease and AAA of 3.4 cm presents to hospital secondary to mechanical fall at home and right hip fracture.   Hospital course; 1. Comminuted right intertrochanteric fracture- Patient seen by orthopedic surgeon.    Patient status post surgery.Patient said to have subsequently developed acute CVA.  Patient was subsequently placed on comfort care measures in the ICU.  Patient's daughter was updated.  Palliative care was consulted.  Patient was placed on comfort care  measures and placed on morphine drip during this admission.  Patient remained comfortable during the course of this admission and subsequently died on 01-21-19 at 09/10/2343.  Family members were notified.  2. Acute cystitis-patient was placed on comfort care measures. 3. Atrial fibrillation-rate controlled. Patient was managed with comfort care measures following acute CVA.   4. Seizure disorder-no recent history of seizures. Stable prior to death 5. Acute CVA in the right ICA MCA territory.  DISCHARGE CONDITIONS:  Patient died. CONSULTS OBTAINED:  Treatment Team:  Leim Fabry, MD Leotis Pain, MD DRUG ALLERGIES:  No Known Allergies DISCHARGE MEDICATIONS:   Allergies as of 2019-01-21   No Known Allergies     Medication List    ASK your doctor about these medications   Calcium Carbonate-Vitamin D 600-400 MG-UNIT tablet Take 1 tablet by mouth daily.   CENTRUM SILVER PO Take 1 tablet by mouth. Once daily in the morning.   beta carotene w/minerals tablet Take 1 tablet by mouth daily.   Dilantin 100 MG ER capsule Generic drug: phenytoin TAKE AS DIRECTED (TOTAL OF 300MG  PER DAY) Ask about: Which instructions should I use?   diltiazem 60 MG tablet Commonly known as: CARDIZEM Take 60 mg by mouth 3 (three) times daily.   Eliquis 5 MG Tabs tablet Generic drug: apixaban Take 5 mg by mouth 2 (two) times daily.   furosemide 20 MG tablet Commonly known as: LASIX Take 20 mg by mouth daily.   metoprolol succinate 50 MG 24 hr  tablet Commonly known as: TOPROL-XL Take 50 mg by mouth 2 (two) times a day.   PHENobarbital 64.8 MG tablet Commonly known as: LUMINAL Take 64.8 mg by mouth at bedtime.        DISCHARGE INSTRUCTIONS:   DIET:  Patient expired DISCHARGE CONDITION:  Expired ACTIVITY:  Patient expired OXYGEN:  Home Oxygen: No.  Oxygen Delivery: Patient expired DISCHARGE LOCATION:  Patient expired  If you experience worsening of your admission  symptoms, develop shortness of breath, life threatening emergency, suicidal or homicidal thoughts you must seek medical attention immediately by calling 911 or calling your MD immediately  if symptoms less severe.  You Must read complete instructions/literature along with all the possible adverse reactions/side effects for all the Medicines you take and that have been prescribed to you. Take any new Medicines after you have completely understood and accpet all the possible adverse reactions/side effects.   Please note  You were cared for by a hospitalist during your hospital stay. If you have any questions about your discharge medications or the care you received while you were in the hospital after you are discharged, you can call the unit and asked to speak with the hospitalist on call if the hospitalist that took care of you is not available. Once you are discharged, your primary care physician will handle any further medical issues. Please note that NO REFILLS for any discharge medications will be authorized once you are discharged, as it is imperative that you return to your primary care physician (or establish a relationship with a primary care physician if you do not have one) for your aftercare needs so that they can reassess your need for medications and monitor your lab values.    Microbiology Results  Results for orders placed or performed during the hospital encounter of 01/02/2019  SARS Coronavirus 2 (CEPHEID - Performed in [place] hospital lab), Hosp Order     Status: None   Collection Time: 12/18/2018 11:31 AM   Specimen: Nasopharyngeal Swab  Result Value Ref Range Status   SARS Coronavirus 2 NEGATIVE NEGATIVE Final    Comment: (NOTE) If result is NEGATIVE SARS-CoV-2 target nucleic acids are NOT DETECTED. The SARS-CoV-2 RNA is generally detectable in upper and lower  respiratory specimens during the acute phase of infection. The lowest  concentration of SARS-CoV-2 viral copies  this assay can detect is 250  copies / mL. A negative result does not preclude SARS-CoV-2 infection  and should not be used as the sole basis for treatment or other  patient management decisions.  A negative result may occur with  improper specimen collection / handling, submission of specimen other  than nasopharyngeal swab, presence of viral mutation(s) within the  areas targeted by this assay, and inadequate number of viral copies  (<250 copies / mL). A negative result must be combined with clinical  observations, patient history, and epidemiological information. If result is POSITIVE SARS-CoV-2 target nucleic acids are DETECTED. The SARS-CoV-2 RNA is generally detectable in upper and lower  respiratory specimens dur ing the acute phase of infection.  Positive  results are indicative of active infection with SARS-CoV-2.  Clinical  correlation with patient history and other diagnostic information is  necessary to determine patient infection status.  Positive results do  not rule out bacterial infection or co-infection with other viruses. If result is PRESUMPTIVE POSTIVE SARS-CoV-2 nucleic acids MAY BE PRESENT.   A presumptive positive result was obtained on the submitted specimen  and confirmed on repeat  testing.  While 2019 novel coronavirus  (SARS-CoV-2) nucleic acids may be present in the submitted sample  additional confirmatory testing may be necessary for epidemiological  and / or clinical management purposes  to differentiate between  SARS-CoV-2 and other Sarbecovirus currently known to infect humans.  If clinically indicated additional testing with an alternate test  methodology [phone]) is advised. The SARS-CoV-2 RNA is generally  detectable in upper and lower respiratory sp ecimens during the acute  phase of infection. The expected result is Negative. Fact Sheet for Patients:  StrictlyIdeas.no Fact Sheet for Healthcare Providers:  BankingDealers.co.za This test is not yet approved or cleared by the [place] FDA and has been authorized for detection and/or diagnosis of SARS-CoV-2 by FDA under an Emergency Use Authorization (EUA).  This EUA will remain in effect (meaning this test can be used) for the duration of the COVID-19 declaration under Section 564(b)(1) of the Act, 21 U.S.C. section 360bbb-3(b)(1), unless the authorization is terminated or revoked sooner. Performed at [facility], [address] [place], [place] [address]   Urine Culture     Status: Abnormal   Collection Time: 12/25/2018 11:31 AM   Specimen: Urine, Random  Result Value Ref Range Status   Specimen Description   Final    URINE, RANDOM Performed at [facility], [address] [place], [place] [address]    Special Requests   Final    NONE Performed at [facility], [address] [place], [place] [address]    Culture >=100,000 COLONIES/mL ESCHERICHIA COLI (A)  Final   Report Status 01/07/2019 FINAL  Final   Organism ID, Bacteria ESCHERICHIA COLI (A)  Final      Susceptibility   Escherichia coli - MIC*    AMPICILLIN >=32 RESISTANT Resistant     CEFAZOLIN <=4 SENSITIVE Sensitive     CEFTRIAXONE <=1 SENSITIVE Sensitive     CIPROFLOXACIN <=0.25 SENSITIVE Sensitive     GENTAMICIN <=1 SENSITIVE Sensitive     IMIPENEM <=0.25 SENSITIVE Sensitive     NITROFURANTOIN <=16 SENSITIVE Sensitive     TRIMETH/SULFA <=20 SENSITIVE Sensitive     AMPICILLIN/SULBACTAM >=32 RESISTANT Resistant     PIP/TAZO <=4 SENSITIVE Sensitive     Extended ESBL NEGATIVE Sensitive     * >=100,000 COLONIES/mL ESCHERICHIA COLI  MRSA PCR Screening     Status: None   Collection Time: 12/22/2018  4:38 AM   Specimen: Nasopharyngeal  Result Value Ref Range Status   MRSA by PCR NEGATIVE NEGATIVE Final    Comment:        The GeneXpert MRSA Assay (FDA approved for NASAL specimens only), is one component of a  comprehensive MRSA colonization surveillance program. It is not intended to diagnose MRSA infection nor to guide or monitor treatment for MRSA infections. Performed at [facility], [address] [place], [place] [address]     RADIOLOGY:  No results found.   Management plans discussed with the patient, family and they are in agreement.  CODE STATUS: Prior   TOTAL TIME TAKING CARE OF THIS PATIENT: 36 minutes.    Sinai Illingworth M.D on 01/12/2019 at 3:34 PM  Between 7am to 6pm - Pager - [phone]  After 6pm go to [email or web address] - password [email or web address] [facility]  [employer] [facility] [place] [facility]  Office  [phone]  CC: Primary care physician; Adin Hector, MD   Note: This dictation was prepared with Dragon dictation along with smaller phrase technology. Any transcriptional errors that result from  this process are unintentional.

## 2019-02-07 NOTE — Final Progress Note (Signed)
ME question about Time of Death. RN charted 09/16/2343 but charted at 2328/09/15 and [facility] told 09/16/43 was time of death which was relayed to MD Boris Lown. Confirmed tonight with Willeen Niece RN that time of death is 2245 pm on Jan 27, 2019.    This note was put in after discharge from system on 01-12-2019 at 09/16/2026. Lucky Cowboy RN [facility]

## 2019-02-07 NOTE — Progress Notes (Addendum)
Pt. expired at 2345. This RN and Loa Socks, RN pronounced.  Family member, Dr. and the [facility] notified. See post-mortem charting. [place] donor services states that pt. may be suitable for eye and tissue donation but they will contact the family. Post-mortem care completed.   Verified absence of respiration,heart rate, and pupil reflex.

## 2019-02-07 NOTE — Progress Notes (Signed)
Palliative:  HPI: 85 y.o.femalewith past medical history of atrial fibrillation (s/p ablation and cardioversion) on Eliquis, s/p pacemaker, CHF, mitral valve disease s/p annuloplasty, AAA 3.4 cm, seizure disorder, anemia of chronic diseaseadmitted on 7/30/2020with fall at home and right hip fracture with surgical repair 7/31.Unfortunately Kelly Swanson has suffered a large right-sided CVA following surgery and prognosis is poor. Subsequently family have decided to proceed with comfort measures. Actively dying - anticipate hospital death.   I met again today at Kelly Swanson's bedside with her 2 daughters. We discussed progression with slight tachypnea, decreasing oxygen saturation, toes are cold, fevers. Family understands that these are signs of progression at EOL.   We also spent some time discussing other signs that they may see for progression at end of life. Daughters are upset but happy to know the facts and status. We discussed how it is easy to forget that she is at end of life and not sleeping. They are at peace but still struggling with losing their mother.   I also updated Dr. Saralyn Pilar and Dr. Caryl Comes to Kelly Swanson's status per family request.   All questions/concerns addressed. Emotional support provided.   Exam: Unresponsive. Tachypnea. Feet cold to touch. Fever. Low sats.   Plan: - Actively dying.  - Morphine bolus given. Continue infusion.  - Anticipate hospital death.   25 min  Vinie Sill, NP Palliative Medicine Team Pager [phone] (Please see [email or web address] for schedule) Team Phone [phone]    Greater than 50%  of this time was spent counseling and coordinating care related to the above assessment and plan

## 2019-02-07 NOTE — Progress Notes (Signed)
[place] at [place] NAME: Kelly Swanson    MR#:  [id number]  DATE OF BIRTH:  March 11, 1932  SUBJECTIVE:  CHIEF COMPLAINT:   Chief Complaint  Patient presents with  . Fall   Patient reported to have had a large right-sided hemispheric CVA following recent right hip surgery..  Patient already placed on comfort care measures and currently on morphine drip.  Resting comfortably  Updated daughter present at bedside on treatment plans.  REVIEW OF SYSTEMS:  ROS unobtainable due to medical condition  DRUG ALLERGIES:  No Known Allergies VITALS:  Blood pressure (!) 95/51, pulse 84, temperature (!) 100.8 F (38.2 C), temperature source Axillary, resp. rate 20, height 5\' 2"  (1.575 m), weight 65.3 kg, SpO2 (!) 80 %. PHYSICAL EXAMINATION:  Physical Exam   GENERAL:85 y.o.-year-old patient lying in the bed with no acute distress.  EYES:Left pupilequal, round, reactive to light and accommodation.No scleral icterus. Extraocular muscles intact.  HEENT: Head atraumatic, normocephalic. Oropharynx and nasopharynx clear.  NECK: Supple, no jugular venous distention. No thyroid enlargement, no tenderness.  LUNGS: Normal breath sounds bilaterally, no wheezing, rales,rhonchi or crepitation. No use of accessory muscles of respiration.Decreased bibasilar breath sounds CARDIOVASCULAR: S1, S2 normal. No rubs, or gallops. 2/6 systolic murmur is present ABDOMEN: Soft, nontender, nondistended. Bowel sounds present. No organomegaly or mass.  EXTREMITIES: No cyanosis, or clubbing. Chronic 2+ bilateral pedal edema noted. Right leg is abducted and externally rotated. NEUROLOGIC: Patient sedated and resting comfortably.  Currently on comfort care measures. PSYCHIATRIC: Resting comfortably. SKIN: No obvious rash, lesion, or ulcer.   LABORATORY PANEL:  Female CBC Recent Labs  Lab 01/07/19 0354  WBC 11.4*  HGB 11.1*  HCT 34.5*  PLT 437*    ------------------------------------------------------------------------------------------------------------------ Chemistries  Recent Labs  Lab 12/22/2018 1130  01/07/19 0354  NA 143   < > 140  K 3.8   < > 4.0  CL 106   < > 106  CO2 28   < > 26  GLUCOSE 142*   < > 155*  BUN 21   < > 17  CREATININE 0.96   < > 0.72  CALCIUM 9.4   < > 8.8*  AST 35  --   --   ALT 26  --   --   ALKPHOS 46  --   --   BILITOT 0.7  --   --    < > = values in this interval not displayed.   RADIOLOGY:  No results found. ASSESSMENT AND PLAN:   [place] a82 y.o.femalewith a known history of atrial fibrillation status post prior ablation and cardioversion, on Eliquis, status post pacemaker, chronic heart failure, mitral valve disease status post annuloplasty, seizure disorder, anemia of chronic disease and AAA of 3.4 cm presents to hospital secondary to mechanical fall at home and right hip fracture.  1. Comminuted right intertrochanteric fracture- Patient seen by orthopedic surgeon.  Day 4 status post surgery. Patient said to have subsequently developed acute CVA. Already placed on comfort care measures and subsequently transferred out of ICU.  I updated patient's 2 daughters present at bedside this morning.  They agree with plan of care and keeping patient comfortable.  Requested for palliative consult and I discussed the option of discharge with hospice with daughters. Patient appears to be terminal continued decreasing respiratory rate.  Patient remains unsafe for transportation at this time.  Family request to continue with comfort care in the hospital with morphine drip at this time.  Palliative care following patient.  Anticipate hospital death  2. Acute cystitis- Now on comfort care measures only.    3. Atrial fibrillation-rate controlled.  Patient now on comfort care measures only.  Anticoagulation previously placed on hold due to recent surgery  4. Seizure disorder-no recent  history of seizures. Continue Dilantin and phenobarbital.  5. Acute CVA in the right ICA MCA territory. Now on comfort care measures  All the records are reviewed and case discussed with Care Management/Social Worker. Management plans discussed with the patient, family and they are in agreement. Updated 2 daughters present at bedside recently.  They agree with plan of care as outlined.  CODE STATUS: DNR  TOTAL TIME TAKING CARE OF THIS PATIENT: 24 minutes.   More than 50% of the time was spent in counseling/coordination of care: YES  POSSIBLE D/C IN 1-2 DAYS, DEPENDING ON CLINICAL CONDITION.   Caide Campi M.D on 02/03/2019 at 1:27 PM  Between 7am to 6pm - Pager - [phone]  After 6pm go to [email or web address] - password [email or web address] [facility]  [employer] [facility] [place] [facility]  Office  [phone]  CC: Primary care physician; Adin Hector, MD  Note: This dictation was prepared with Dragon dictation along with smaller phrase technology. Any transcriptional errors that result from this process are unintentional.

## 2019-02-07 DEATH — deceased

## 2019-04-14 IMAGING — US US ABDOMEN COMPLETE
1 series · 13 of 25 positions shown · non-contrast
Comparison: 02/24/2016, 10/04/2014

CLINICAL DATA: History of abdominal aneurysm

EXAM:
ABDOMEN ULTRASOUND COMPLETE

[Series 1: us abdomen complete · 0.14mm/px · 13 of 193 slices shown]
[im 1/193]
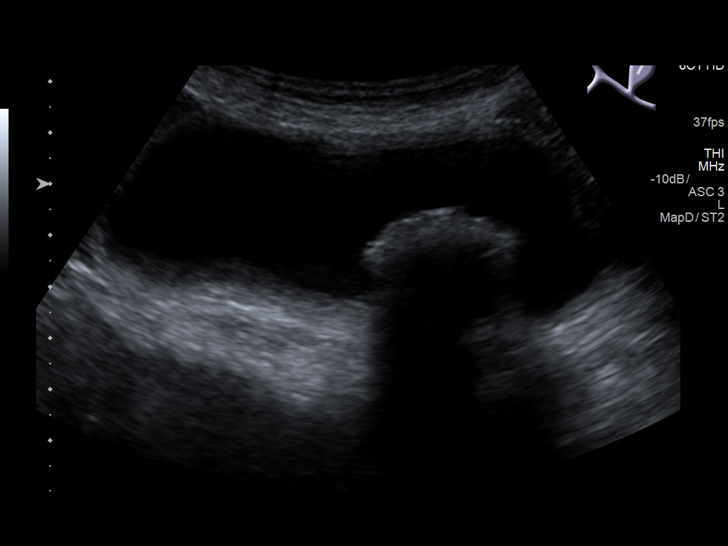
[im 17/193]
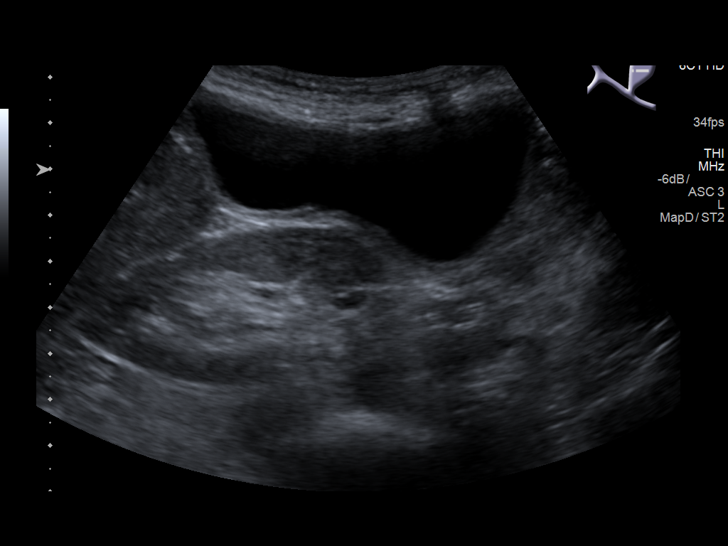
[im 33/193]
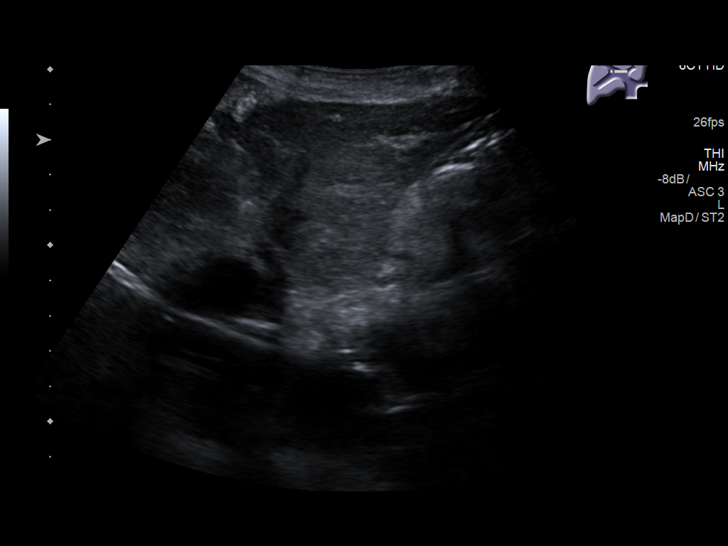
[im 49/193]
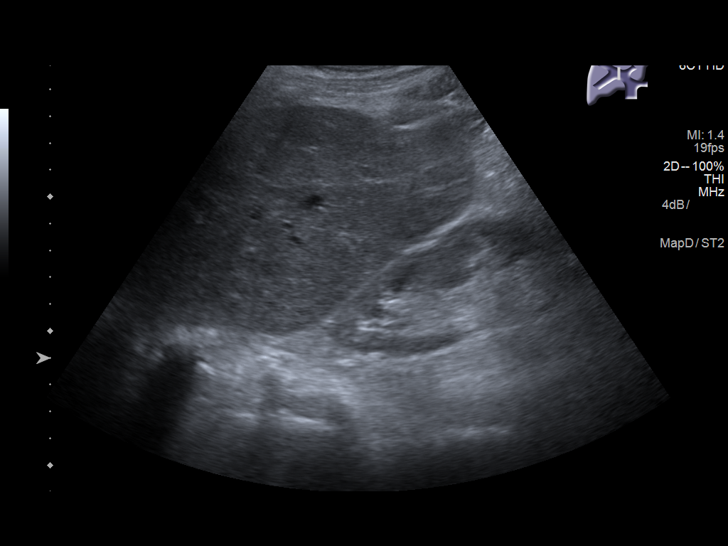
[im 65/193]
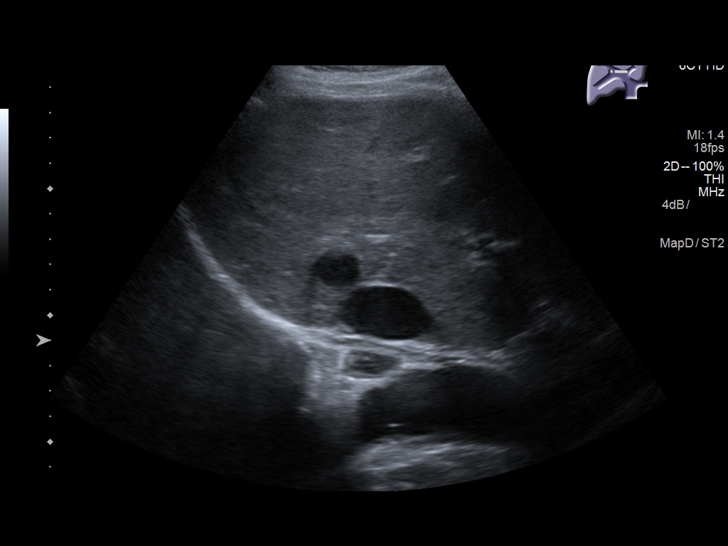
[im 81/193]
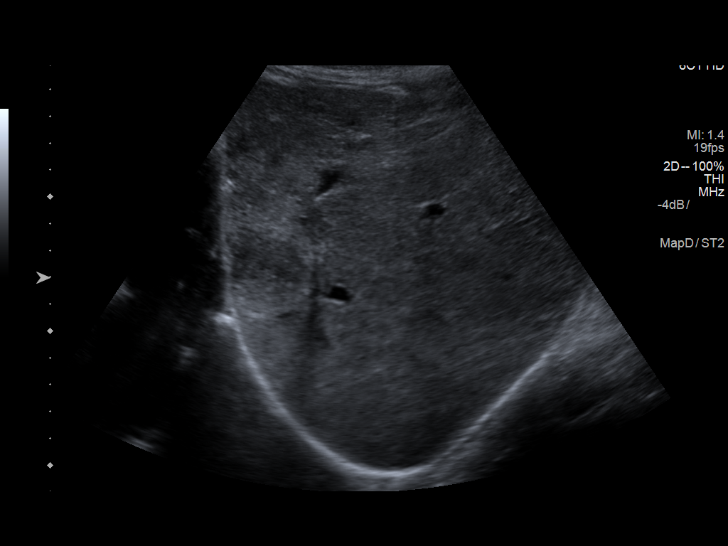
[im 97/193]
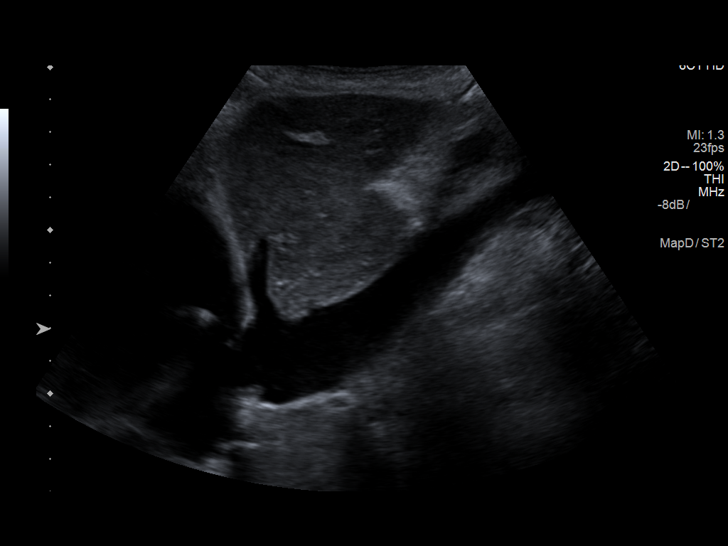
[im 113/193]
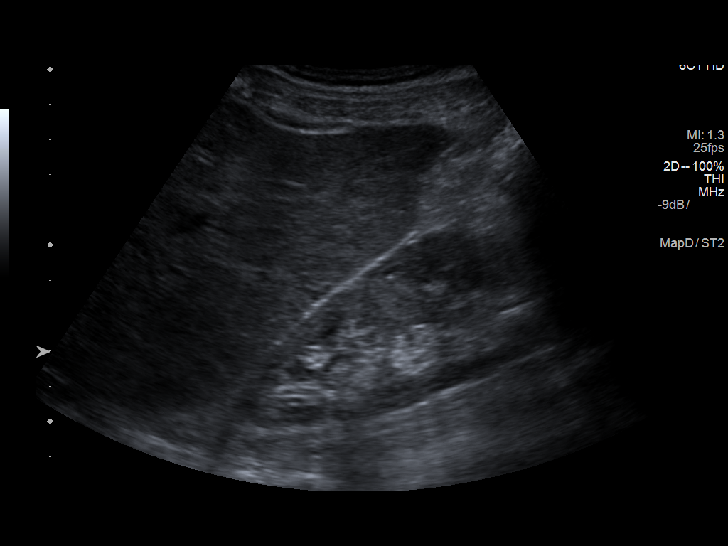
[im 129/193]
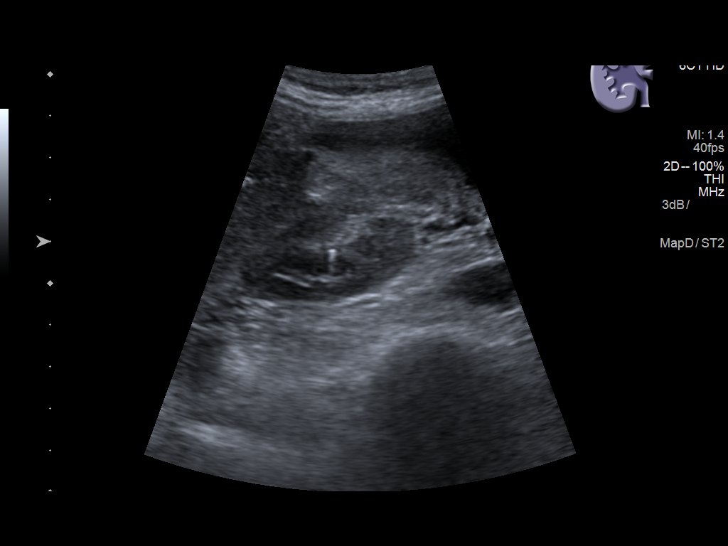
[im 145/193]
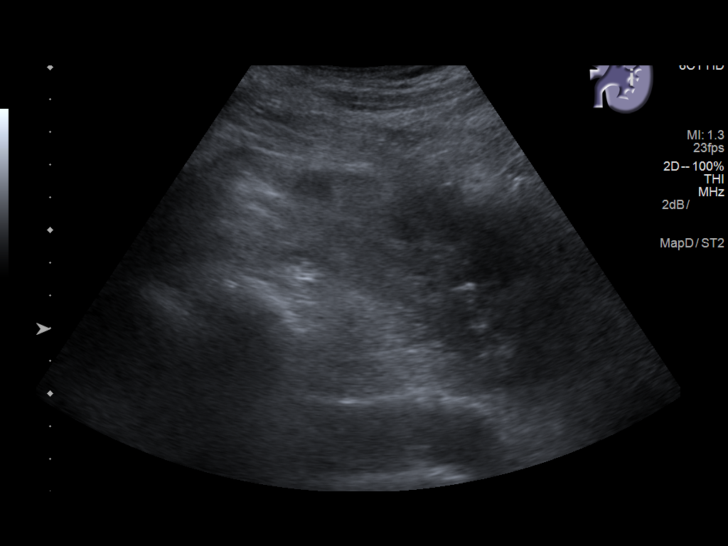
[im 161/193]
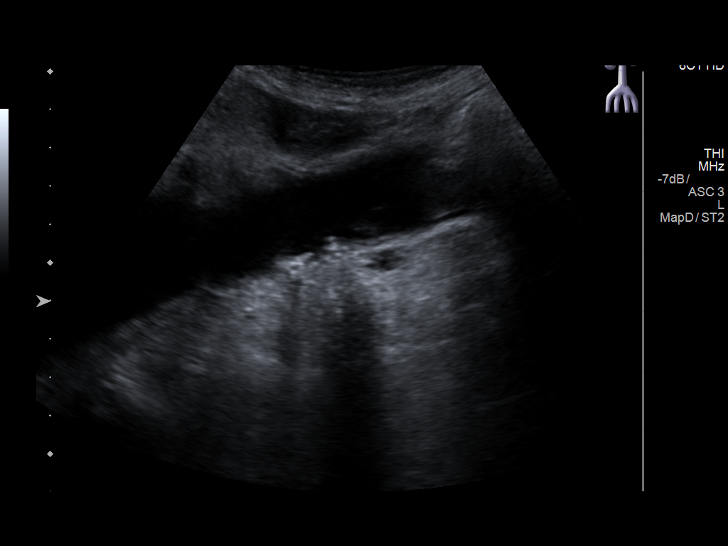
[im 177/193]
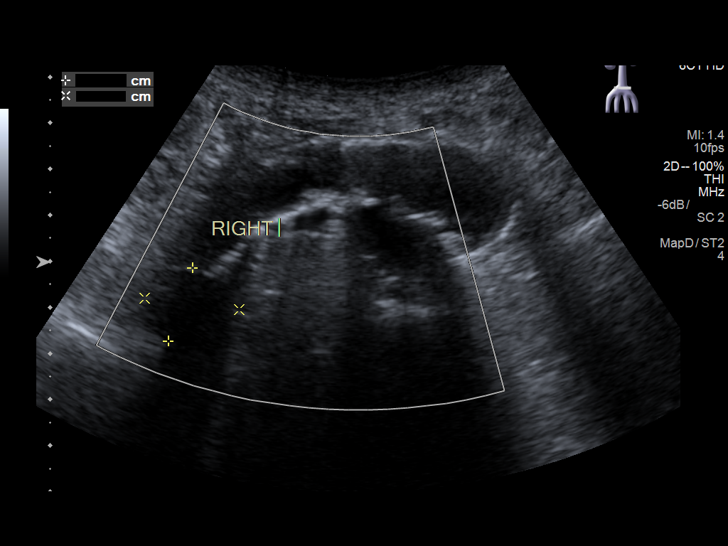
[im 193/193]
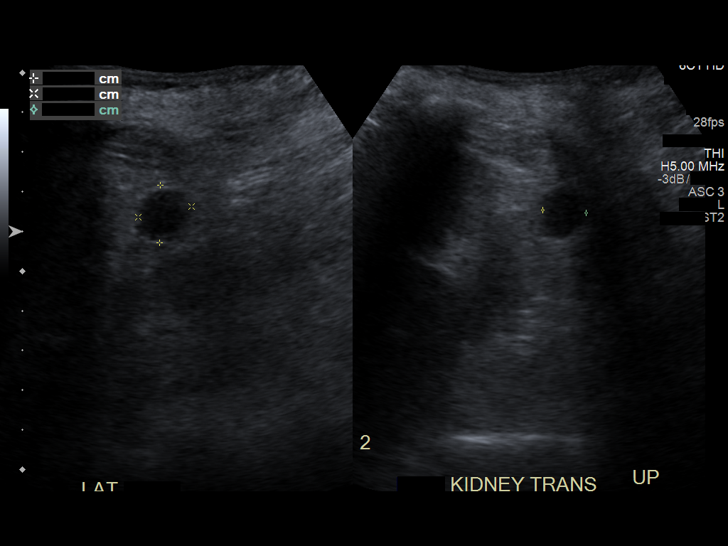

[13 of 25 positions shown; findings below may reference images not displayed]

FINDINGS: Gallbladder: Cholelithiasis

Common bile duct: Diameter: 12 mm

Liver: Within the right lobe of the liver there is a 9.1 x 4.7 x
cm hyperechoic mass lesion. This appears similar to a lesion seen on
prior chest CT from 10/04/2014. Decreased flow is noted within the
inferior vena cava adjacent to this lesion. Portal vein is patent on
color Doppler imaging with normal direction of blood flow towards
the liver.

IVC: No abnormality visualized.

Pancreas: Visualized portion unremarkable.

Spleen: Size and appearance within normal limits.

Right Kidney: Length: 10.1 cm.. Echogenicity within normal limits.
No mass or hydronephrosis visualized.

Left Kidney: Length: 10.5 cm.. No hydronephrosis is noted. Cysts are
noted within the left kidney. The largest of these measures 3.3 cm.

Abdominal aorta: Dilatation of the abdominal aorta to 3.4 cm is
noted. This is stable from the prior exam.

Other findings: None.
IMPRESSION: Stable dilatation of the abdominal aorta. Continued follow-up in 2
years by means of ultrasound is recommended.

Hyperechoic mass lesion within the right lobe of the liver. This has
increased somewhat in size when compared with the prior CT of the
chest from 5152. Again further evaluation by means of MRI is
recommended.

Cholelithiasis.

Prominent common bile duct which may be related to the patient's
age. The possibility of distal obstruction could not be totally
excluded. This could also be evaluated on MRI.

These results will be called to the ordering clinician or
representative by the Radiologist Assistant, and communication
documented in the PACS or zVision Dashboard.

## 2021-06-13 IMAGING — XA OPERATIVE RIGHT HIP WITH PELVIS
5 series · 5 of 5 positions shown · non-contrast
Comparison: 01/05/2019.

CLINICAL DATA: Right hip surgery.

EXAM:
OPERATIVE RIGHT HIP (WITH PELVIS IF PERFORMED) 4 VIEWS
TECHNIQUE: Fluoroscopic spot image(s) were submitted for interpretation
post-operatively.

[Series 6001: p1 · 1 of 1 slices shown]
[im 1/1]
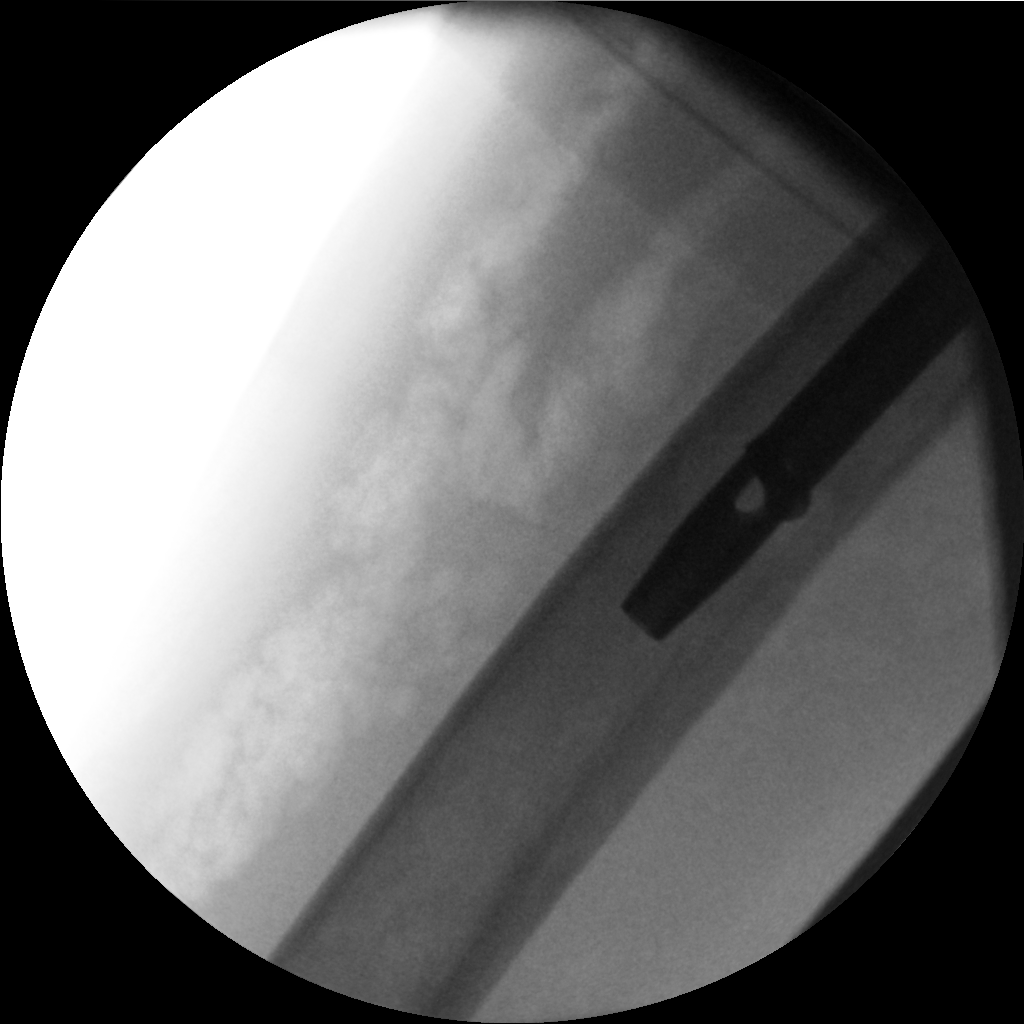

[Series 6002: p2 · 1 of 1 slices shown]
[im 1/1]
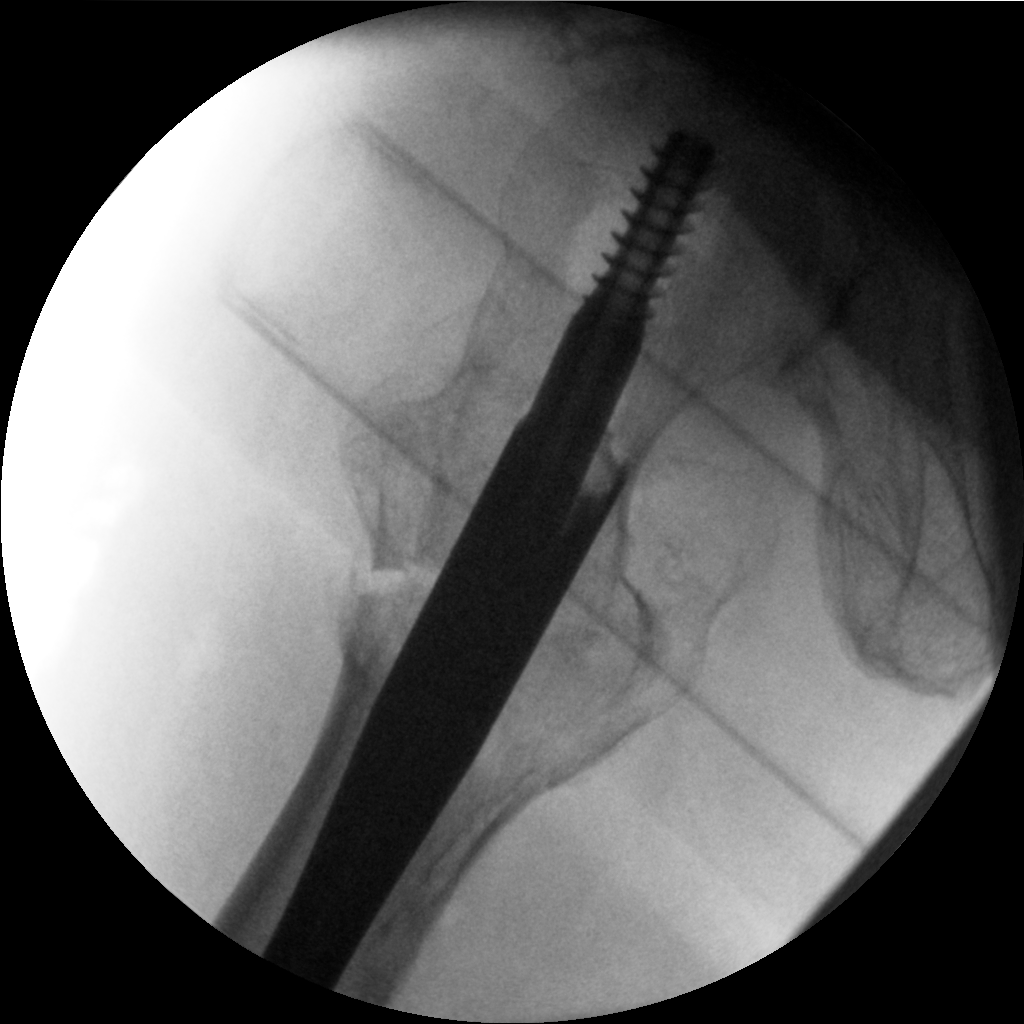

[Series 6003: p3 · 1 of 1 slices shown]
[im 1/1]
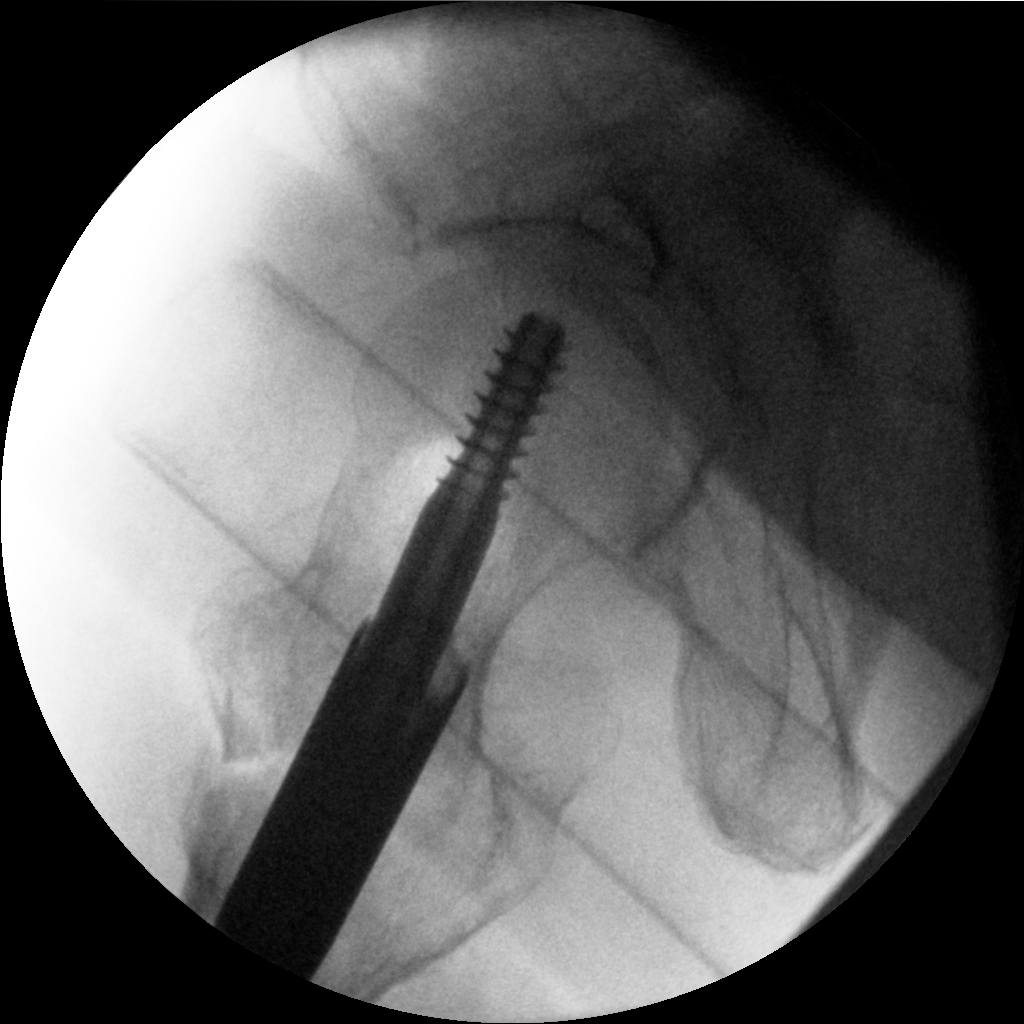

[Series 6004: p4 · 1 of 1 slices shown]
[im 1/1]
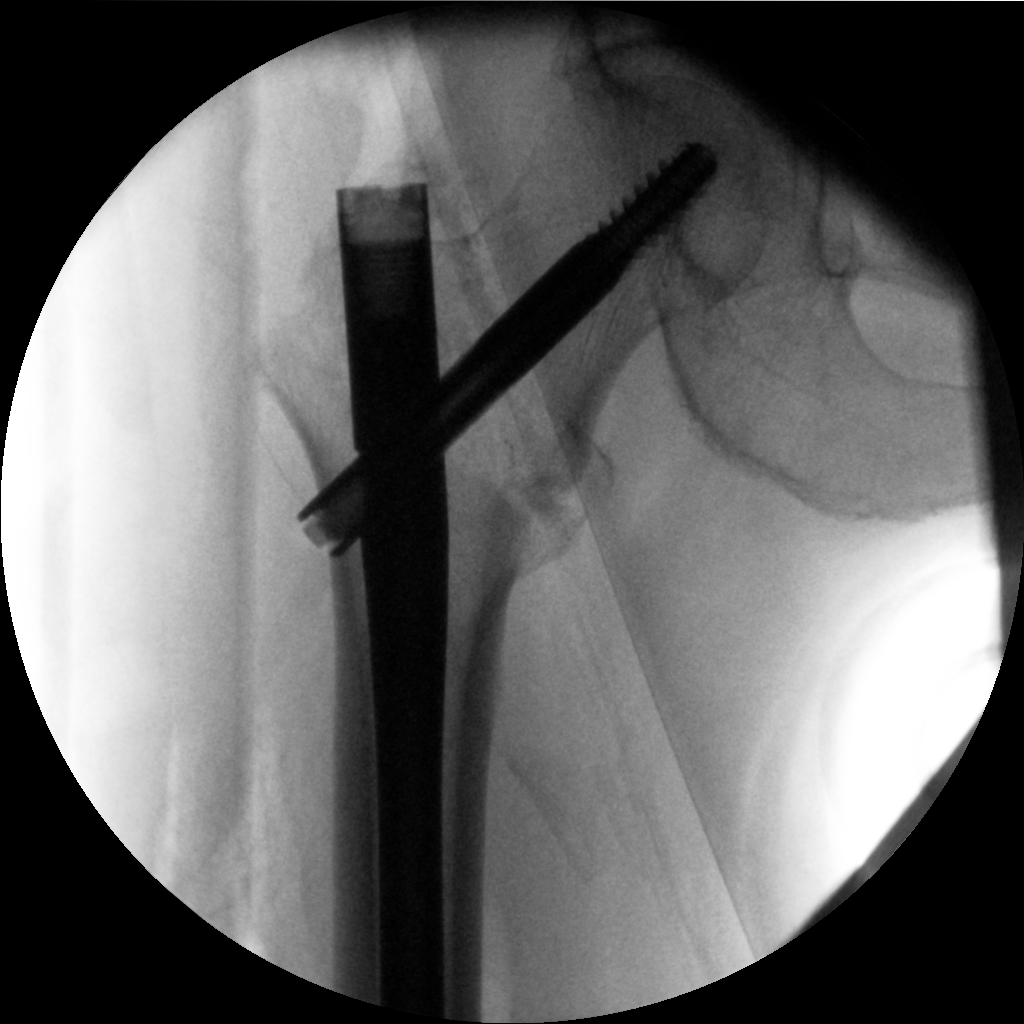

[Series 6005: p5 · 1 of 1 slices shown]
[im 1/1]
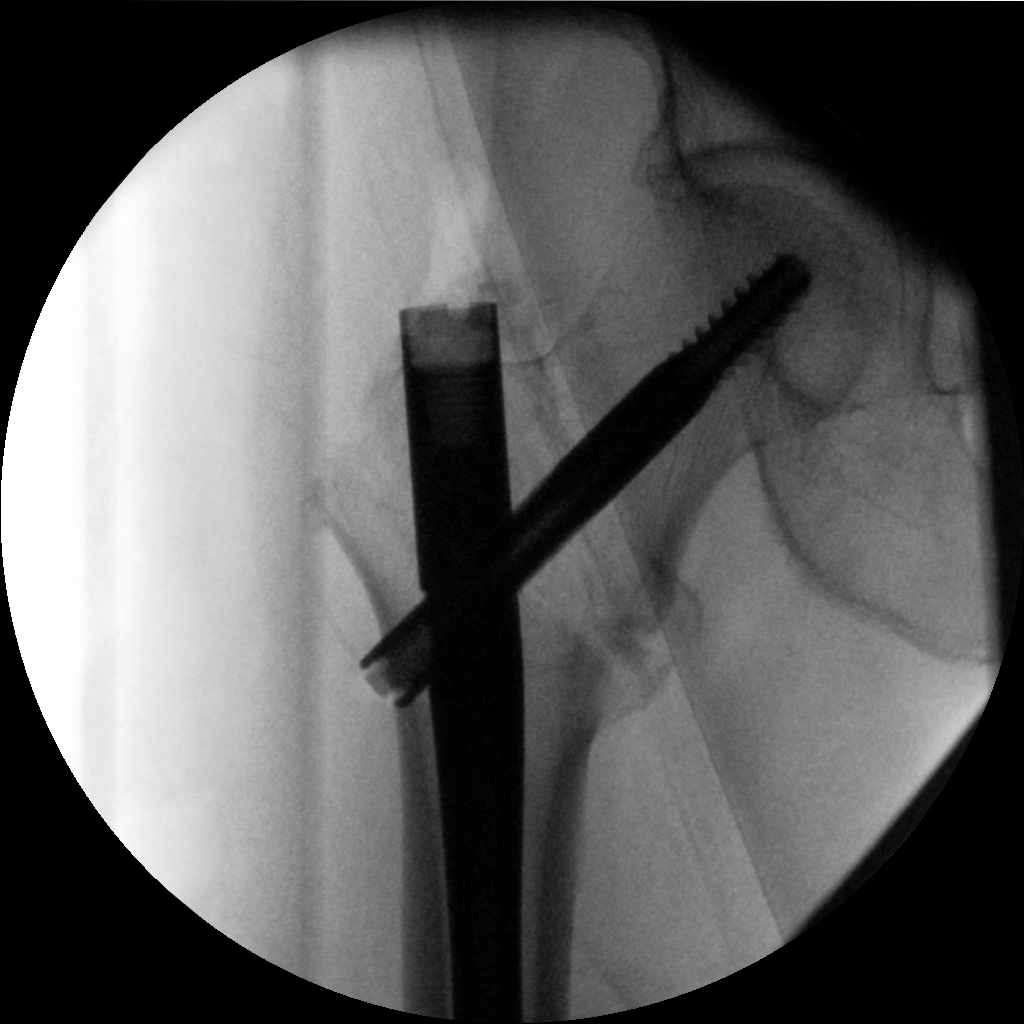

[5 of 5 positions shown; findings below may reference images not displayed]

FINDINGS: ORIF right hip.  Hardware intact.  Anatomic alignment.
IMPRESSION: ORIF right hip.  Anatomic alignment.
